# Patient Record
Sex: Female | Born: 1945
Health system: Southern US, Community
[De-identification: ages and names within clinical notes are randomized; demographics above are authoritative.]

## PROBLEM LIST (undated history)

## (undated) DIAGNOSIS — K589 Irritable bowel syndrome without diarrhea: Secondary | ICD-10-CM

## (undated) HISTORY — PX: ABDOMINAL HYSTERECTOMY: SHX81

---

## 2004-11-03 ENCOUNTER — Other Ambulatory Visit: Admission: RE | Admit: 2004-11-03 | Discharge: 2004-11-03 | Payer: Self-pay | Admitting: Obstetrics and Gynecology

## 2010-01-11 ENCOUNTER — Encounter: Admission: RE | Admit: 2010-01-11 | Discharge: 2010-01-11 | Payer: Self-pay | Admitting: Obstetrics and Gynecology

## 2014-12-15 DIAGNOSIS — I728 Aneurysm of other specified arteries: Secondary | ICD-10-CM | POA: Insufficient documentation

## 2014-12-16 DIAGNOSIS — G43709 Chronic migraine without aura, not intractable, without status migrainosus: Secondary | ICD-10-CM | POA: Insufficient documentation

## 2016-12-14 DIAGNOSIS — E785 Hyperlipidemia, unspecified: Secondary | ICD-10-CM | POA: Insufficient documentation

## 2016-12-14 DIAGNOSIS — M25511 Pain in right shoulder: Secondary | ICD-10-CM | POA: Insufficient documentation

## 2016-12-14 DIAGNOSIS — M8580 Other specified disorders of bone density and structure, unspecified site: Secondary | ICD-10-CM | POA: Insufficient documentation

## 2016-12-14 DIAGNOSIS — Z860100 Personal history of colon polyps, unspecified: Secondary | ICD-10-CM | POA: Insufficient documentation

## 2016-12-14 DIAGNOSIS — E559 Vitamin D deficiency, unspecified: Secondary | ICD-10-CM | POA: Insufficient documentation

## 2016-12-14 DIAGNOSIS — G4700 Insomnia, unspecified: Secondary | ICD-10-CM | POA: Insufficient documentation

## 2016-12-14 DIAGNOSIS — I10 Essential (primary) hypertension: Secondary | ICD-10-CM | POA: Insufficient documentation

## 2016-12-14 DIAGNOSIS — K219 Gastro-esophageal reflux disease without esophagitis: Secondary | ICD-10-CM | POA: Insufficient documentation

## 2016-12-14 DIAGNOSIS — N959 Unspecified menopausal and perimenopausal disorder: Secondary | ICD-10-CM | POA: Insufficient documentation

## 2020-04-28 DIAGNOSIS — K5792 Diverticulitis of intestine, part unspecified, without perforation or abscess without bleeding: Secondary | ICD-10-CM | POA: Insufficient documentation

## 2021-06-16 ENCOUNTER — Other Ambulatory Visit: Payer: Self-pay | Admitting: Internal Medicine

## 2021-06-16 DIAGNOSIS — Z1231 Encounter for screening mammogram for malignant neoplasm of breast: Secondary | ICD-10-CM

## 2021-06-16 DIAGNOSIS — E2839 Other primary ovarian failure: Secondary | ICD-10-CM

## 2021-06-16 DIAGNOSIS — I82492 Acute embolism and thrombosis of other specified deep vein of left lower extremity: Secondary | ICD-10-CM

## 2021-06-18 ENCOUNTER — Other Ambulatory Visit: Payer: Self-pay | Admitting: Internal Medicine

## 2021-06-18 DIAGNOSIS — I6529 Occlusion and stenosis of unspecified carotid artery: Secondary | ICD-10-CM

## 2021-06-21 ENCOUNTER — Ambulatory Visit
Admission: RE | Admit: 2021-06-21 | Discharge: 2021-06-21 | Disposition: A | Discharge status: Home / Self Care | Payer: Medicare Other | Source: Ambulatory Visit | Attending: Internal Medicine | Admitting: Internal Medicine

## 2021-06-21 DIAGNOSIS — I82492 Acute embolism and thrombosis of other specified deep vein of left lower extremity: Secondary | ICD-10-CM

## 2021-07-05 ENCOUNTER — Ambulatory Visit
Admission: RE | Admit: 2021-07-05 | Discharge: 2021-07-05 | Disposition: A | Discharge status: Home / Self Care | Payer: Medicare Other | Source: Ambulatory Visit | Attending: Internal Medicine | Admitting: Internal Medicine

## 2021-07-05 DIAGNOSIS — I6529 Occlusion and stenosis of unspecified carotid artery: Secondary | ICD-10-CM

## 2021-07-21 ENCOUNTER — Encounter (HOSPITAL_BASED_OUTPATIENT_CLINIC_OR_DEPARTMENT_OTHER): Payer: Self-pay | Admitting: Physical Therapy

## 2021-07-21 ENCOUNTER — Ambulatory Visit (HOSPITAL_BASED_OUTPATIENT_CLINIC_OR_DEPARTMENT_OTHER): Payer: Medicare Other | Attending: Internal Medicine | Admitting: Physical Therapy

## 2021-07-21 DIAGNOSIS — M6281 Muscle weakness (generalized): Secondary | ICD-10-CM | POA: Insufficient documentation

## 2021-07-21 DIAGNOSIS — M25561 Pain in right knee: Secondary | ICD-10-CM | POA: Insufficient documentation

## 2021-07-21 DIAGNOSIS — G8929 Other chronic pain: Secondary | ICD-10-CM | POA: Diagnosis present

## 2021-07-21 DIAGNOSIS — M25562 Pain in left knee: Secondary | ICD-10-CM | POA: Diagnosis present

## 2021-07-21 NOTE — Therapy (Signed)
?OUTPATIENT PHYSICAL THERAPY LOWER EXTREMITY EVALUATION ? ? ?Patient Name: Adrienne Luna ?MRN: [id number] ?DOB:1945/04/02, 76 y.o., female ?Today's Date: 07/21/2021 ? ? PT End of Session - 07/21/21 1145   ? ? Visit Number 1   ? Date for PT Re-Evaluation 09/15/21   ? Authorization Type Medicare   ? PT Start Time 1145   ? PT Stop Time 1230   ? PT Time Calculation (min) 45 min   ? Activity Tolerance Patient tolerated treatment well   ? ?  ?  ? ?  ? ? ?History reviewed. No pertinent past medical history. ?History reviewed. No pertinent surgical history. ?There are no problems to display for this patient. ? ? ? ?REFERRING PROVIDER: Dr. Rochele Raring ? 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 ?THERAPY DIAG:  ?Bil knee pain; knee stiffness; weakness ? ?ONSET DATE: 03/07/21 ? ?SUBJECTIVE:  ? ?SUBJECTIVE STATEMENT: ?Reports a long history of pain in both knees (left worse) and sciatica on left back/buttock constantly.  I go to a Wellness doctor for 6 weeks doing red light therapy and muscle testing.  It hasn't worked.   ?Walking limited < 1/2 mile;  I almost fell down the stairs when knee gave out.  Scared about descending stairs ? ? ? ?PERTINENT HISTORY:  Moved here from [place] ?Occipital neuralgia  ?GERD (Nexium);  preventive med for BP;  aspirin ?Surgery in August colonectomy had a blood clot in leg ?Vit D3 ?Night take Prevastatin ?Husband has medical issues and she's been caregiver ?4 foot surgeries on right foot ? ?PAIN:  ?Are you having pain? Yes ?NPRS scale: left worse than right  /10 ?Pain location: bil knees  ?  ?Aggravating factors: on my feet; after sitting a while then get up; up and down stairs  ?Relieving factors: copper sleeves  ? ?PRECAUTIONS: None ? ?WEIGHT BEARING RESTRICTIONS No ? ?FALLS:  ?Has patient fallen in last 6 months? No ? ?LIVING ENVIRONMENT: ?Lives with: lives with their spouse ?Lives in: House/apartment ?Stairs: Yes: Internal: 10 steps; on right going up ?OCCUPATION:  retired ? ?PLOF: Independent  young grandchildren ? ?PATIENT GOALS I want to walk to [facility] from the house 1 1/2 miles  ? ? ?OBJECTIVE:  ? ?DIAGNOSTIC FINDINGS: left meniscus tear 2022 had injections ? ?PATIENT SURVEYS:  ?FOTO 43% ? ?COGNITION: ? Overall cognitive status: Within functional limits for tasks assessed   ?  ? ? ?MUSCLE LENGTH: ?Hamstrings: Right 50 deg; Left 50 deg ?Dec hip flexor length to 5 degrees bil ? ?SLS left 3 sec, 2 sec right ? ? ?LE ROM: ? ?Active ROM Right ?07/21/2021 Left ?07/21/2021  ?Hip flexion 100 100  ?Hip extension    ?Hip abduction    ?Hip adduction    ?Hip internal rotation    ?Hip external rotation    ?Knee flexion 127 130  ?Knee extension 3 3  ?Ankle dorsiflexion    ?Ankle plantarflexion    ?Ankle inversion    ?Ankle eversion    ? (Blank rows = not tested) ? ?LE MMT:  Able to rise from standard chair 1x without UE assist ? ?MMT Right ?07/21/2021 Left ?07/21/2021  ?Hip flexion 4+ 4+  ?Hip extension    ?Hip abduction 3+ 3+  ?Hip adduction    ?Hip internal rotation    ?Hip external rotation    ?Knee flexion 4+ 4+  ?Knee extension 4- 4-  ?Ankle dorsiflexion    ?Ankle plantarflexion    ?Ankle inversion    ?Ankle  eversion    ? (Blank rows = not tested) ? ?LOWER EXTREMITY SPECIAL TESTS:  ?Negative hip scour.  No LE neural signs with SLR ? ?FUNCTIONAL TESTS:  ?Able to ascend and descend 4 steps reciprocally with 1 railing ? ?GAIT: ? ?Comments: No device needed;  dec step length  ? ? ? ?TODAY'S TREATMENT: ?Treatment plan ? ? ?PATIENT EDUCATION:  ?Education details: treatment plan ?Person educated: Patient ?Education method: Explanation ?Education comprehension: verbalized understanding ? ? ?HOME EXERCISE PROGRAM: ?Start next visit  ? ?ASSESSMENT: ? ?CLINICAL IMPRESSION: ?Patient is a 76 y.o. female who was seen today for physical therapy evaluation and treatment for bilateral knee pain.  The patient demonstrates mildly decreased knee flexion and extension range of motion.   Strength deficits and asymmetry with opposite knee include decreased quad strength and gluteal strength particularly glute medius muscle with obvious pelvic drop noted in standing and during gait.  Decreased muscle length noted as well in hip flexors and hamstrings.  These deficits and pain cause functional impairments with standing, walking, going up and down curbs and steps at home and in the community.     ? ? ?OBJECTIVE IMPAIRMENTS decreased activity tolerance, difficulty walking, decreased ROM, decreased strength, increased fascial restrictions, and pain.  ? ?ACTIVITY LIMITATIONS cleaning, community activity, and meal prep.  ? ?PERSONAL FACTORS Time since onset of injury/illness/exacerbation are also affecting patient's functional outcome.  ? ? ?REHAB POTENTIAL: Good ? ?CLINICAL DECISION MAKING: Stable/uncomplicated ? ?EVALUATION COMPLEXITY: Low ? ? ?GOALS: ?Goals reviewed with patient? Yes ? ?SHORT TERM GOALS: Target date: 08/18/2021  (Remove Blue Hyperlink) ? ?The patient will demonstrate knowledge of basic self care strategies and exercises to promote healing   ?Baseline: ?Goal status: INITIAL ? ?2.  The patient will report a 35% improvement in pain levels with functional activities which are currently difficult including walking and standing ?Baseline:  ?Goal status: INITIAL ? ?3.  The patient will be able to walk 3/4 mile  ?Baseline:  ?Goal status: INITIAL ? ?4.  The patient will have improved knee and hip strength to at least 4/5 needed for standing, walking longer distances and descending stairs at home and in the community  ?Baseline:  ?Goal status: INITIAL ? ?5.  Improved bil knee ROM 0-135 degrees needed for greater ease with ascending and descending stairs ?Baseline:  ?Goal status: INITIAL ? ? ?LONG TERM GOALS: Target date: 09/15/2021  (Remove Blue Hyperlink) ? ?The patient will be independent in a safe self progression of a home exercise program to promote further recovery of function   ?Baseline:   ?Goal status: INITIAL ? ?2.  The patient will report a 65% improvement in pain levels with functional activities which are currently difficult including walking and standing  ?Baseline:  ?Goal status: INITIAL ? ?3.  The patient will have improved knee and hip strength to at least 4+/5 needed for standing, walking longer distances and descending stairs at home and in the community  ?Baseline:  ?Goal status: INITIAL ? ?4.  The patient will be able to walk 1 1/2 miles to [place] [facility] ?Baseline:  ?Goal status: INITIAL ? ?5.  The patient will have improved FOTO score to    60%   indicating improved function with less pain  ?Baseline:  ?Goal status: INITIAL ? ? ?PLAN: ?PT FREQUENCY: 2x/week ? ?PT DURATION: 8 weeks ? ?PLANNED INTERVENTIONS: Therapeutic exercises, Therapeutic activity, Neuromuscular re-education, Balance training, Gait training, Patient/Family education, Joint mobilization, Aquatic Therapy, Dry Needling, Electrical stimulation, Cryotherapy, Moist heat, Taping, Vasopneumatic  device, Ultrasound, Ionotophoresis 4mg /ml Dexamethasone, and Manual therapy ? ?PLAN FOR NEXT SESSION: aquatic and land based bil knee ROM, strengthening and balance/proprioception ? ?Ruben Im, PT ?07/21/21 3:50 PM ?Phone: [phone] ?Fax: [phone]  ? ? ?

## 2021-09-14 ENCOUNTER — Ambulatory Visit: Payer: Medicare Other | Attending: Internal Medicine | Admitting: Physical Therapy

## 2021-09-14 DIAGNOSIS — M170 Bilateral primary osteoarthritis of knee: Secondary | ICD-10-CM | POA: Diagnosis not present

## 2021-09-14 DIAGNOSIS — G8929 Other chronic pain: Secondary | ICD-10-CM

## 2021-09-14 DIAGNOSIS — M6281 Muscle weakness (generalized): Secondary | ICD-10-CM

## 2021-09-14 DIAGNOSIS — M25552 Pain in left hip: Secondary | ICD-10-CM
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
## 2021-09-21 ENCOUNTER — Ambulatory Visit: Payer: Self-pay

## 2021-09-21 ENCOUNTER — Other Ambulatory Visit: Payer: Self-pay

## 2021-09-22 ENCOUNTER — Ambulatory Visit: Payer: Medicare Other | Admitting: Physical Therapy

## 2021-09-22 DIAGNOSIS — G8929 Other chronic pain: Secondary | ICD-10-CM

## 2021-09-22 DIAGNOSIS — M6281 Muscle weakness (generalized): Secondary | ICD-10-CM

## 2021-09-22 DIAGNOSIS — M25552 Pain in left hip: Secondary | ICD-10-CM

## 2021-09-22 DIAGNOSIS — M170 Bilateral primary osteoarthritis of knee: Secondary | ICD-10-CM | POA: Diagnosis not present

## 2021-09-22 NOTE — Therapy (Signed)
OUTPATIENT PHYSICAL THERAPY LOWER EXTREMITY PROGRESS NOTE/RECERTIFICATION   Patient Name: Adrienne Luna MRN: [id number] DOB:[birth year], 76 y.o., female Today's Date: 09/22/2021   PT End of Session - 09/22/21 1113     Visit Number 3    Date for PT Re-Evaluation 11/09/21    Authorization Type Medicare    PT Start Time 1109   late   PT Stop Time 1153   PT Time Calculation (min) 41 min    Activity Tolerance Patient tolerated treatment well             No past medical history on file. No past surgical history on file. There are no problems to display for this patient.    REFERRING PROVIDER: Dr. Rochele Raring  REFERRING DIAG: M17.0 Bil OA of knee  THERAPY DIAG:  Bil knee pain; knee stiffness; weakness  ONSET DATE: 03/07/21  SUBJECTIVE:   SUBJECTIVE STATEMENT: My hip is still hurting.  Especially after 4 hour [place].  I think I overdid it with stretch strap.    PERTINENT HISTORY:  Moved here from [place] Occipital neuralgia  GERD (Nexium);  preventive med for BP;  aspirin Surgery in August colonectomy had a blood clot in leg Vit D3 Night take Prevastatin Husband has medical issues and she's been caregiver 4 foot surgeries on right foot  PAIN:  Are you having pain? Yes NPRS scale: left worse than right  3/10 Pain location: bil knees; left hip   Aggravating factors: on my feet; after sitting a while then get up; up and down stairs  Relieving factors: copper sleeves  PRECAUTIONS: None  WEIGHT BEARING RESTRICTIONS No  FALLS:  Has patient fallen in last 6 months? No  LIVING ENVIRONMENT: Lives with: lives with their spouse Lives in: House/apartment Stairs: Yes: Internal: 10 steps; on right going up OCCUPATION: retired  PLOF: Independent  young grandchildren  PATIENT GOALS I want to walk to [employer] from the house 1 1/2 miles    OBJECTIVE:   DIAGNOSTIC FINDINGS: left meniscus tear 2022 had  injections  PATIENT SURVEYS:  FOTO 43%  COGNITION:  Overall cognitive status: Within functional limits for tasks assessed       MUSCLE LENGTH: Hamstrings: Right 50 deg; Left 50 deg Dec hip flexor length to 5 degrees bil  SLS left 3 sec, 2 sec right   LE ROM:  Active ROM Right 09/14/2021 Left 09/14/2021  Hip flexion 100 100  Hip extension    Hip abduction    Hip adduction    Hip internal rotation    Hip external rotation    Knee flexion 127 130  Knee extension 3 3  Ankle dorsiflexion    Ankle plantarflexion    Ankle inversion    Ankle eversion     (Blank rows = not tested)  LE MMT:  Able to rise from standard chair 10x without UE assist  MMT Right 09/14/2021 Left 09/14/2021  Hip flexion 4+ 4+  Hip extension    Hip abduction 3+ 3+  Hip adduction    Hip internal rotation    Hip external rotation    Knee flexion 4+ 4+  Knee extension 4- 4-  Ankle dorsiflexion    Ankle plantarflexion    Ankle inversion    Ankle eversion     (Blank rows = not tested)  LOWER EXTREMITY SPECIAL TESTS:  Negative hip scour.  No LE neural signs with SLR  FUNCTIONAL TESTS:  Able to ascend and descend  4 steps reciprocally with 1 railing  GAIT:  Comments: No device needed;  dec step length     TODAY'S TREATMENT: 7/19: Clam 10x left Manual therapy: soft tissue mobilization to left gluteals, lateral thigh, hip long axis distraction, inferior mob, PA hip in internal rotation; contract/relax piriformis/glutes Trigger Point Dry-Needling  Treatment instructions: Expect mild to moderate muscle soreness. S/S of pneumothorax if dry needled over a lung field, and to seek immediate medical attention should they occur. Patient verbalized understanding of these instructions and education.  Patient Consent Given: Yes Education handout provided: Yes Muscles treated: left gluteals  Treatment response/outcome: improved soft tissue length Neuromuscular re-education to activate glute medius  muscle Standing pillow on wall hip abduction isometric 5x 5 sec hold right/left 2 inch step down 10x right/left      7/11: Supine HS stretch with strap straight and with bias right/left 30 sec holds Supine piriformis/fig 4 stretch 3x 30 sec holds right/left Supine abdominal brace 5x Supine abdominal brace with hand to knee push isometric Sidelying clam 10x right/left Seated abdominal brace with back of chair 10x Sit to stand 10x     PATIENT EDUCATION:  Education details: treatment plan Person educated: Patient Education method: Explanation Education comprehension: verbalized understanding   HOME EXERCISE PROGRAM: Access Code: Access Code: [email or web address] URL: [email or web address] Date: 09/22/2021 Prepared by: Lavinia Sharps  Exercises - Seated Scapular Retraction  - 2 x daily - 7 x weekly - 1 sets - 10 reps - Seated Cervical Sidebending Stretch  - 2 x daily - 7 x weekly - 1 sets - 3 reps - 30 hold - Seated Levator Scapulae Stretch (Mirrored)  - 2 x daily - 7 x weekly - 1 sets - 3 reps - 30 hold URL: [email or web address] Date: 09/14/2021 Prepared by: Lavinia Sharps  Exercises - Hooklying Hamstring Stretch with Strap  - 1 x daily - 7 x weekly - 1 sets - 3 reps - 30 hold - Hip Adductors and Hamstring Stretch with Strap  - 1 x daily - 7 x weekly - 1 sets - 3 reps - 30 hold - Supine Figure 4 Piriformis Stretch  - 1 x daily - 7 x weekly - 1 sets - 3 reps - 30 hold - Supine Transversus Abdominis Bracing - Hands on Ground  - 1 x daily - 7 x weekly - 1 sets - 10 reps - Hooklying Isometric Hip Flexion  - 1 x daily - 7 x weekly - 1 sets - 10 reps - Clamshell  - 1 x daily - 7 x weekly - 1 sets - 10 reps - Sit to Stand  - 1 x daily - 7 x weekly - 1 sets - 10 reps   ASSESSMENT:  CLINICAL IMPRESSION: The patient's glute muscles have numerous tender points and inhibit muscle activation causing hip pain and affecting patellofemoral knee alignment.  Good  response to DN of these muscles with much improved activation following which will improve knee control with ascending/descending stairs, prolonged standing and walking.  Verbal cues to avoid compensatory strategies with ex's.          OBJECTIVE IMPAIRMENTS decreased activity tolerance, difficulty walking, decreased ROM, decreased strength, increased fascial restrictions, and pain.   ACTIVITY LIMITATIONS cleaning, community activity, and meal prep.   PERSONAL FACTORS Time since onset of injury/illness/exacerbation are also affecting patient's functional outcome.    REHAB POTENTIAL: Good  CLINICAL DECISION MAKING: Stable/uncomplicated  EVALUATION COMPLEXITY: Low   GOALS: Goals reviewed with patient? Yes  SHORT TERM  GOALS: Target date: 10/12/2021    The patient will demonstrate knowledge of basic self care strategies and exercises to promote healing   Baseline: Goal status: INITIAL  2.  The patient will report a 35% improvement in pain levels with functional activities which are currently difficult including walking and standing Baseline:  Goal status: INITIAL  3.  The patient will be able to walk 3/4 mile  Baseline:  Goal status: INITIAL  4.  The patient will have improved knee and hip strength to at least 4/5 needed for standing, walking longer distances and descending stairs at home and in the community  Baseline:  Goal status: INITIAL  5.  Improved bil knee ROM 0-135 degrees needed for greater ease with ascending and descending stairs Baseline:  Goal status: INITIAL   LONG TERM GOALS: Target date: 11/09/2021    The patient will be independent in a safe self progression of a home exercise program to promote further recovery of function   Baseline:  Goal status: INITIAL  2.  The patient will report a 65% improvement in pain levels with functional activities which are currently difficult including walking and standing  Baseline:  Goal status: INITIAL  3.  The  patient will have improved knee and hip strength to at least 4+/5 needed for standing, walking longer distances and descending stairs at home and in the community  Baseline:  Goal status: INITIAL  4.  The patient will be able to walk 1 1/2 miles to [employer] [facility] Baseline:  Goal status: INITIAL  5.  The patient will have improved FOTO score to    60%   indicating improved function with less pain  Baseline:  Goal status: INITIAL   PLAN: PT FREQUENCY: 2x/week  PT DURATION: 8 weeks  PLANNED INTERVENTIONS: Therapeutic exercises, Therapeutic activity, Neuromuscular re-education, Balance training, Gait training, Patient/Family education, Joint mobilization, Aquatic Therapy, Dry Needling, Electrical stimulation, Cryotherapy, Moist heat, Taping, Vasopneumatic device, Ultrasound, Ionotophoresis 4mg /ml Dexamethasone, and Manual therapy  PLAN FOR NEXT SESSION: assess response to DN to gluteals;  may add ES and include ITB;  add hip flexor stretch on 2nd step;  Nu-Step;   step ups and downs;  hip abduction isometrics;  leg press;  ;  glute med strengthening;   bil knee and hip ROM, strengthening and balance/proprioception  , PT 09/22/21 12:05 PM Phone: [phone] Fax: [phone] Fax: [phone]

## 2021-09-22 NOTE — Patient Instructions (Signed)

## 2021-09-30 ENCOUNTER — Ambulatory Visit: Payer: Medicare Other | Admitting: Physical Therapy

## 2021-09-30 DIAGNOSIS — M25552 Pain in left hip: Secondary | ICD-10-CM

## 2021-09-30 DIAGNOSIS — M170 Bilateral primary osteoarthritis of knee: Secondary | ICD-10-CM | POA: Diagnosis not present

## 2021-09-30 DIAGNOSIS — M6281 Muscle weakness (generalized): Secondary | ICD-10-CM

## 2021-09-30 DIAGNOSIS — G8929 Other chronic pain: Secondary | ICD-10-CM

## 2021-09-30 NOTE — Therapy (Signed)
OUTPATIENT PHYSICAL THERAPY LOWER EXTREMITY PROGRESS NOTE   Patient Name: Adrienne Luna MRN: [id number] DOB:08/07/1945, 76 y.o., female Today's Date: 09/30/2021   PT End of Session - 09/30/21 1058     Visit Number 4    Date for PT Re-Evaluation 11/09/21    Authorization Type Medicare    PT Start Time 1100    PT Stop Time 1140    PT Time Calculation (min) 40 min    Activity Tolerance Patient tolerated treatment well                 No past medical history on file. No past surgical history on file. There are no problems to display for this patient.    REFERRING PROVIDER: Dr. 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  I've had a horrible week.  Everything I do makes it hurt worse.  I've been a couch potato.     PERTINENT HISTORY:  Moved here from [employer] [place] Occipital neuralgia  GERD (Nexium);  preventive med for BP;  aspirin Surgery in August colonectomy had a blood clot in leg Vit D3 Night take Prevastatin Husband has medical issues and she's been caregiver 4 foot surgeries on right foot  PAIN:  Are you having pain? Yes NPRS scale: left worse than right  6/10 Pain location: bil knees; left hip   Aggravating factors: on my feet; after sitting a while then get up; up and down stairs  Relieving factors: copper sleeves  PRECAUTIONS: None  WEIGHT BEARING RESTRICTIONS No  FALLS:  Has patient fallen in last 6 months? No  LIVING ENVIRONMENT: Lives with: lives with their spouse Lives in: House/apartment Stairs: Yes: Internal: 10 steps; on right going up OCCUPATION: retired  PLOF: Independent  young grandchildren  PATIENT GOALS I want to walk to [employer] from the house 1 1/2 miles    OBJECTIVE:   DIAGNOSTIC FINDINGS: left meniscus tear 2022 had injections  PATIENT  SURVEYS:  FOTO 43%  COGNITION:  Overall cognitive status: Within functional limits for tasks assessed       MUSCLE LENGTH: Hamstrings: Right 50 deg; Left 50 deg Dec hip flexor length to 5 degrees bil  SLS left 3 sec, 2 sec right   LE ROM:  Active ROM Right 09/14/2021 Left 09/14/2021  Hip flexion 100 100  Hip extension    Hip abduction    Hip adduction    Hip internal rotation    Hip external rotation    Knee flexion 127 130  Knee extension 3 3  Ankle dorsiflexion    Ankle plantarflexion    Ankle inversion    Ankle eversion     (Blank rows = not tested)  LE MMT:  Able to rise from standard chair 10x without UE assist  MMT Right 09/14/2021 Left 09/14/2021  Hip flexion 4+ 4+  Hip extension    Hip abduction 3+ 3+  Hip adduction    Hip internal rotation    Hip external rotation    Knee flexion 4+ 4+  Knee extension 4- 4-  Ankle dorsiflexion    Ankle plantarflexion    Ankle inversion    Ankle eversion     (Blank rows = not tested)  LOWER EXTREMITY SPECIAL TESTS:  Negative hip scour.  No LE neural signs with SLR  FUNCTIONAL TESTS:  Able to ascend and descend 4 steps reciprocally with 1 railing  GAIT:  Comments: No device needed;  dec step length     TODAY'S TREATMENT:  7/27:  Manual therapy: soft tissue mobilization to left gluteals, lateral thigh, hip long axis distraction, inferior mob, PA hip in internal rotation; contract/relax piriformis/glutes;  Addaday to gluteals Supine with green ball: hip/knee flexion 8x, lumbar rotation 8x; piriformis stretch right/left Seated green ball roll outs for lumbar fascial stretch forward and with side to side bias Standing hip abduction and extension ROM Discussed "muscle pumping" with exercise to help with pain reduction Gave info on home TENS unit to help with upcoming travel     7/19: Clam 10x left Manual therapy: soft tissue mobilization to left gluteals, lateral thigh, hip long axis distraction, inferior  mob, PA hip in internal rotation; contract/relax piriformis/glutes Trigger Point Dry-Needling  Treatment instructions: Expect mild to moderate muscle soreness. S/S of pneumothorax if dry needled over a lung field, and to seek immediate medical attention should they occur. Patient verbalized understanding of these instructions and education.  Patient Consent Given: Yes Education handout provided: Yes Muscles treated: left gluteals  Treatment response/outcome: improved soft tissue length Neuromuscular re-education to activate glute medius muscle Standing pillow on wall hip abduction isometric 5x 5 sec hold right/left 2 inch step down 10x right/left      7/11: Supine HS stretch with strap straight and with bias right/left 30 sec holds Supine piriformis/fig 4 stretch 3x 30 sec holds right/left Supine abdominal brace 5x Supine abdominal brace with hand to knee push isometric Sidelying clam 10x right/left Seated abdominal brace with back of chair 10x Sit to stand 10x     PATIENT EDUCATION:  Education details: treatment plan Person educated: Patient Education method: Explanation Education comprehension: verbalized understanding   HOME EXERCISE PROGRAM: Access Code: Access Code: [email or web address] URL: [email or web address] Date: 09/22/2021 Prepared by: Lavinia Sharps  Exercises - Seated Scapular Retraction  - 2 x daily - 7 x weekly - 1 sets - 10 reps - Seated Cervical Sidebending Stretch  - 2 x daily - 7 x weekly - 1 sets - 3 reps - 30 hold - Seated Levator Scapulae Stretch (Mirrored)  - 2 x daily - 7 x weekly - 1 sets - 3 reps - 30 hold URL: [email or web address] Date: 09/14/2021 Prepared by: Lavinia Sharps  Exercises - Hooklying Hamstring Stretch with Strap  - 1 x daily - 7 x weekly - 1 sets - 3 reps - 30 hold - Hip Adductors and Hamstring Stretch with Strap  - 1 x daily - 7 x weekly - 1 sets - 3 reps - 30 hold - Supine Figure 4 Piriformis Stretch  - 1 x  daily - 7 x weekly - 1 sets - 3 reps - 30 hold - Supine Transversus Abdominis Bracing - Hands on Ground  - 1 x daily - 7 x weekly - 1 sets - 10 reps - Hooklying Isometric Hip Flexion  - 1 x daily - 7 x weekly - 1 sets - 10 reps - Clamshell  - 1 x daily - 7 x weekly - 1 sets - 10 reps - Sit to Stand  - 1 x daily - 7 x weekly - 1 sets - 10 reps   ASSESSMENT:  CLINICAL IMPRESSION: The patient reports a flare up/change in pain location to higher on hip following DN last session.  She states the soreness has affected function and mobility.  Other techniques performed manually for myofascial release  today and encouraged regular ROM to further help with pain modulation.  Therapist monitoring response to all interventions and modifying treatment accordingly.     OBJECTIVE IMPAIRMENTS decreased activity tolerance, difficulty walking, decreased ROM, decreased strength, increased fascial restrictions, and pain.   ACTIVITY LIMITATIONS cleaning, community activity, and meal prep.   PERSONAL FACTORS Time since onset of injury/illness/exacerbation are also affecting patient's functional outcome.    REHAB POTENTIAL: Good  CLINICAL DECISION MAKING: Stable/uncomplicated  EVALUATION COMPLEXITY: Low   GOALS: Goals reviewed with patient? Yes  SHORT TERM GOALS: Target date: 10/12/2021    The patient will demonstrate knowledge of basic self care strategies and exercises to promote healing   Baseline: Goal status: INITIAL  2.  The patient will report a 35% improvement in pain levels with functional activities which are currently difficult including walking and standing Baseline:  Goal status: INITIAL  3.  The patient will be able to walk 3/4 mile  Baseline:  Goal status: INITIAL  4.  The patient will have improved knee and hip strength to at least 4/5 needed for standing, walking longer distances and descending stairs at home and in the community  Baseline:  Goal status: INITIAL  5.  Improved  bil knee ROM 0-135 degrees needed for greater ease with ascending and descending stairs Baseline:  Goal status: INITIAL   LONG TERM GOALS: Target date: 11/09/2021    The patient will be independent in a safe self progression of a home exercise program to promote further recovery of function   Baseline:  Goal status: INITIAL  2.  The patient will report a 65% improvement in pain levels with functional activities which are currently difficult including walking and standing  Baseline:  Goal status: INITIAL  3.  The patient will have improved knee and hip strength to at least 4+/5 needed for standing, walking longer distances and descending stairs at home and in the community  Baseline:  Goal status: INITIAL  4.  The patient will be able to walk 1 1/2 miles to [employer] [facility] Baseline:  Goal status: INITIAL  5.  The patient will have improved FOTO score to    60%   indicating improved function with less pain  Baseline:  Goal status: INITIAL   PLAN: PT FREQUENCY: 2x/week  PT DURATION: 8 weeks  PLANNED INTERVENTIONS: Therapeutic exercises, Therapeutic activity, Neuromuscular re-education, Balance training, Gait training, Patient/Family education, Joint mobilization, Aquatic Therapy, Dry Needling, Electrical stimulation, Cryotherapy, Moist heat, Taping, Vasopneumatic device, Ultrasound, Ionotophoresis 4mg /ml Dexamethasone, and Manual therapy  PLAN FOR NEXT SESSION: pt out of town to [facility] next week; pt plans to order TENS;  hold on DN based on flare up response;  add hip flexor stretch on 2nd step;  Nu-Step;   step ups and downs;  hip abduction isometrics;  leg press;   glute med strengthening;   bil knee and hip ROM, strengthening and balance/proprioception  [facility], PT 09/30/21 5:14 PM Phone: [phone] Fax: [phone]

## 2021-09-30 NOTE — Patient Instructions (Signed)

## 2021-10-04 ENCOUNTER — Ambulatory Visit: Payer: Medicare Other | Admitting: Physical Therapy

## 2021-10-04 ENCOUNTER — Encounter: Payer: Self-pay | Admitting: Physical Therapy

## 2021-10-04 DIAGNOSIS — M170 Bilateral primary osteoarthritis of knee: Secondary | ICD-10-CM | POA: Diagnosis not present

## 2021-10-04 DIAGNOSIS — M6281 Muscle weakness (generalized): Secondary | ICD-10-CM

## 2021-10-04 DIAGNOSIS — M25552 Pain in left hip: Secondary | ICD-10-CM

## 2021-10-04 DIAGNOSIS — G8929 Other chronic pain: Secondary | ICD-10-CM

## 2021-10-04 NOTE — Therapy (Signed)
OUTPATIENT PHYSICAL THERAPY LOWER EXTREMITY PROGRESS NOTE   Patient Name: Adrienne Luna MRN: [id number] DOB:10-26-1945, 76 y.o., female Today's Date: 10/04/2021   PT End of Session - 10/04/21 1148     Visit Number 5    Date for PT Re-Evaluation 11/09/21    Authorization Type Medicare    PT Start Time 1148    PT Stop Time 1230    PT Time Calculation (min) 42 min    Activity Tolerance Patient tolerated treatment well    Behavior During Therapy [facility] for tasks assessed/performed                  History reviewed. No pertinent past medical history. History reviewed. No pertinent surgical history. There are no problems to display for this patient.    REFERRING PROVIDER: Dr. Hillard Danker  REFERRING DIAG: M17.0 Bil OA of knee  THERAPY DIAG:  Bil knee pain; knee stiffness; weakness  ONSET DATE: 03/07/21  SUBJECTIVE:   SUBJECTIVE STATEMENT: Hip is improving. I got a massager Sat and a TENS unit.   PERTINENT HISTORY:  Moved here from [employer] [place]  Occipital neuralgia  GERD (Nexium);  preventive med for BP;  aspirin Surgery in August colonectomy had a blood clot in leg Vit D3 Night take Prevastatin Husband has medical issues and she's been caregiver 4 foot surgeries on right foot  PAIN:  Are you having pain? Yes NPRS scale: left worse than right  5/10 Pain location: bil knees; left hip   Aggravating factors: on my feet; after sitting a while then get up; up and down stairs  Relieving factors: copper sleeves  PRECAUTIONS: None  WEIGHT BEARING RESTRICTIONS No  FALLS:  Has patient fallen in last 6 months? No  LIVING ENVIRONMENT: Lives with: lives with their spouse Lives in: House/apartment Stairs: Yes: Internal: 10 steps; on right going up OCCUPATION: retired  PLOF: Independent  young grandchildren  PATIENT GOALS I want to walk to [employer] from the house 1 1/2 miles    OBJECTIVE:   DIAGNOSTIC FINDINGS: left meniscus  tear 2022 had injections  PATIENT SURVEYS:  FOTO 43%  COGNITION:  Overall cognitive status: Within functional limits for tasks assessed       MUSCLE LENGTH: Hamstrings: Right 50 deg; Left 50 deg Dec hip flexor length to 5 degrees bil  SLS left 3 sec, 2 sec right   LE ROM:  Active ROM Right 09/14/2021 Left 09/14/2021  Hip flexion 100 100  Hip extension    Hip abduction    Hip adduction    Hip internal rotation    Hip external rotation    Knee flexion 127 130  Knee extension 3 3  Ankle dorsiflexion    Ankle plantarflexion    Ankle inversion    Ankle eversion     (Blank rows = not tested)  LE MMT:  Able to rise from standard chair 10x without UE assist  MMT Right 09/14/2021 Left 09/14/2021  Hip flexion 4+ 4+  Hip extension    Hip abduction 3+ 3+  Hip adduction    Hip internal rotation    Hip external rotation    Knee flexion 4+ 4+  Knee extension 4- 4-  Ankle dorsiflexion    Ankle plantarflexion    Ankle inversion    Ankle eversion     (Blank rows = not tested)  LOWER EXTREMITY SPECIAL TESTS:  Negative hip scour.  No LE neural signs with SLR  FUNCTIONAL TESTS:  Able to ascend  and descend 4 steps reciprocally with 1 railing  GAIT:  Comments: No device needed;  dec step length     TODAY'S TREATMENT:  10/04/21: MANUAL: Hands on and Addaday assist soft tissue work and MFR to RT hip in sidelying and supine. SELF CARE: Reviewed Massage use and parameters for home use. Pt verbally understood all concepts. Review of TENS unit but looks like pt bought one that only fits the lumbar spine and will not be able to be used on her hip.     7/27:  Manual therapy: soft tissue mobilization to left gluteals, lateral thigh, hip long axis distraction, inferior mob, PA hip in internal rotation; contract/relax piriformis/glutes;  Addaday to gluteals Supine with green ball: hip/knee flexion 8x, lumbar rotation 8x; piriformis stretch right/left Seated green ball roll  outs for lumbar fascial stretch forward and with side to side bias Standing hip abduction and extension ROM Discussed "muscle pumping" with exercise to help with pain reduction Gave info on home TENS unit to help with upcoming travel     7/19: Clam 10x left Manual therapy: soft tissue mobilization to left gluteals, lateral thigh, hip long axis distraction, inferior mob, PA hip in internal rotation; contract/relax piriformis/glutes Trigger Point Dry-Needling  Treatment instructions: Expect mild to moderate muscle soreness. S/S of pneumothorax if dry needled over a lung field, and to seek immediate medical attention should they occur. Patient verbalized understanding of these instructions and education.  Patient Consent Given: Yes Education handout provided: Yes Muscles treated: left gluteals  Treatment response/outcome: improved soft tissue length Neuromuscular re-education to activate glute medius muscle Standing pillow on wall hip abduction isometric 5x 5 sec hold right/left 2 inch step down 10x right/left      7/11: Supine HS stretch with strap straight and with bias right/left 30 sec holds Supine piriformis/fig 4 stretch 3x 30 sec holds right/left Supine abdominal brace 5x Supine abdominal brace with hand to knee push isometric Sidelying clam 10x right/left Seated abdominal brace with back of chair 10x Sit to stand 10x     PATIENT EDUCATION:  Education details: treatment plan Person educated: Patient Education method: Explanation Education comprehension: verbalized understanding   HOME EXERCISE PROGRAM: Access Code: Access Code: [email or web address] URL: [email or web address] Date: 09/22/2021 Prepared by: Lavinia Sharps  Exercises - Seated Scapular Retraction  - 2 x daily - 7 x weekly - 1 sets - 10 reps - Seated Cervical Sidebending Stretch  - 2 x daily - 7 x weekly - 1 sets - 3 reps - 30 hold - Seated Levator Scapulae Stretch (Mirrored)  - 2 x daily - 7 x  weekly - 1 sets - 3 reps - 30 hold URL: [email or web address] Date: 09/14/2021 Prepared by: Lavinia Sharps  Exercises - Hooklying Hamstring Stretch with Strap  - 1 x daily - 7 x weekly - 1 sets - 3 reps - 30 hold - Hip Adductors and Hamstring Stretch with Strap  - 1 x daily - 7 x weekly - 1 sets - 3 reps - 30 hold - Supine Figure 4 Piriformis Stretch  - 1 x daily - 7 x weekly - 1 sets - 3 reps - 30 hold - Supine Transversus Abdominis Bracing - Hands on Ground  - 1 x daily - 7 x weekly - 1 sets - 10 reps - Hooklying Isometric Hip Flexion  - 1 x daily - 7 x weekly - 1 sets - 10 reps - Clamshell  - 1 x daily - 7 x weekly - 1 sets -  10 reps - Sit to Stand  - 1 x daily - 7 x weekly - 1 sets - 10 reps   ASSESSMENT:  CLINICAL IMPRESSION: Pt arrives to pt with improving hip pain but it still bothers her. Pt has purchased a massage gun and is currently using. The TENS unit she bought seems to be for only the lumbar spine so she will return and find another one. Soft tissue and myofascial release provided to the Lt hip, LT Rectus Femoris was like a thick rubber band.     OBJECTIVE IMPAIRMENTS decreased activity tolerance, difficulty walking, decreased ROM, decreased strength, increased fascial restrictions, and pain.   ACTIVITY LIMITATIONS cleaning, community activity, and meal prep.   PERSONAL FACTORS Time since onset of injury/illness/exacerbation are also affecting patient's functional outcome.    REHAB POTENTIAL: Good  CLINICAL DECISION MAKING: Stable/uncomplicated  EVALUATION COMPLEXITY: Low   GOALS: Goals reviewed with patient? Yes  SHORT TERM GOALS: Target date: 10/12/2021    The patient will demonstrate knowledge of basic self care strategies and exercises to promote healing   Baseline: Goal status: INITIAL  2.  The patient will report a 35% improvement in pain levels with functional activities which are currently difficult including walking and  standing Baseline:  Goal status: INITIAL  3.  The patient will be able to walk 3/4 mile  Baseline:  Goal status: INITIAL  4.  The patient will have improved knee and hip strength to at least 4/5 needed for standing, walking longer distances and descending stairs at home and in the community  Baseline:  Goal status: INITIAL  5.  Improved bil knee ROM 0-135 degrees needed for greater ease with ascending and descending stairs Baseline:  Goal status: INITIAL   LONG TERM GOALS: Target date: 11/09/2021    The patient will be independent in a safe self progression of a home exercise program to promote further recovery of function   Baseline:  Goal status: INITIAL  2.  The patient will report a 65% improvement in pain levels with functional activities which are currently difficult including walking and standing  Baseline:  Goal status: INITIAL  3.  The patient will have improved knee and hip strength to at least 4+/5 needed for standing, walking longer distances and descending stairs at home and in the community  Baseline:  Goal status: INITIAL  4.  The patient will be able to walk 1 1/2 miles to [employer] [facility] Baseline:  Goal status: INITIAL  5.  The patient will have improved FOTO score to    60%   indicating improved function with less pain  Baseline:  Goal status: INITIAL   PLAN: PT FREQUENCY: 2x/week  PT DURATION: 8 weeks  PLANNED INTERVENTIONS: Therapeutic exercises, Therapeutic activity, Neuromuscular re-education, Balance training, Gait training, Patient/Family education, Joint mobilization, Aquatic Therapy, Dry Needling, Electrical stimulation, Cryotherapy, Moist heat, Taping, Vasopneumatic device, Ultrasound, Ionotophoresis 4mg /ml Dexamethasone, and Manual therapy  PLAN FOR NEXT SESSION: pt out of town to [facility] next week; pt plans to order TENS;  hold on DN based on flare up response;  add hip flexor stretch on 2nd step;  Nu-Step;   step ups and downs;  hip  abduction isometrics;  leg press;   glute med strengthening;   bil knee and hip ROM, strengthening and balance/proprioception  [facility], PTA 10/04/21 12:58 PM  Phone: [phone] Fax: [phone]

## 2021-10-05 ENCOUNTER — Ambulatory Visit: Payer: Medicare Other | Admitting: Physical Therapy

## 2021-10-07 ENCOUNTER — Encounter: Payer: Medicare Other | Admitting: Physical Therapy

## 2021-10-12 ENCOUNTER — Encounter: Payer: Medicare Other | Admitting: Physical Therapy

## 2021-10-14 ENCOUNTER — Encounter: Payer: Medicare Other | Admitting: Physical Therapy

## 2021-10-19 ENCOUNTER — Ambulatory Visit: Payer: Medicare Other | Attending: Internal Medicine | Admitting: Physical Therapy

## 2021-10-19 DIAGNOSIS — M25552 Pain in left hip: Secondary | ICD-10-CM | POA: Diagnosis present

## 2021-10-19 DIAGNOSIS — M25561 Pain in right knee: Secondary | ICD-10-CM | POA: Insufficient documentation

## 2021-10-19 DIAGNOSIS — G8929 Other chronic pain: Secondary | ICD-10-CM | POA: Insufficient documentation

## 2021-10-19 DIAGNOSIS — M25562 Pain in left knee: Secondary | ICD-10-CM | POA: Insufficient documentation

## 2021-10-19 DIAGNOSIS — M6281 Muscle weakness (generalized): Secondary | ICD-10-CM | POA: Diagnosis present

## 2021-10-19 NOTE — Therapy (Signed)
OUTPATIENT PHYSICAL THERAPY LOWER EXTREMITY PROGRESS NOTE   Patient Name: Adrienne Luna MRN: [id number] DOB:10/19/1945, 75 y.o., female Today's Date: 10/19/2021   PT End of Session - 10/19/21 1241     Visit Number 6    Date for PT Re-Evaluation 11/09/21    Authorization Type Medicare    PT Start Time 1234    PT Stop Time 1315    PT Time Calculation (min) 41 min    Activity Tolerance Patient tolerated treatment well                  No past medical history on file. No past surgical history on file. There are no problems to display for this patient.    REFERRING PROVIDER: Dr. 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  Lots of steps and walking on the trip ([place]).  My hip hurts worse.  Knees 80% better.    PERTINENT HISTORY:  Moved here from [place]  Occipital neuralgia  GERD (Nexium);  preventive med for BP;  aspirin Surgery in August colonectomy had a blood clot in leg Vit D3 Night take Prevastatin Husband has medical issues and she's been caregiver 4 foot surgeries on right foot  PAIN:  Are you having pain? Yes NPRS scale: left buttock pain  5/10 Pain location: bil knees; left hip   Aggravating factors: on my feet; after sitting a while then get up; up and down stairs  Relieving factors: copper sleeves  PRECAUTIONS: None  WEIGHT BEARING RESTRICTIONS No  FALLS:  Has patient fallen in last 6 months? No  LIVING ENVIRONMENT: Lives with: lives with their spouse Lives in: House/apartment Stairs: Yes: Internal: 10 steps; on right going up OCCUPATION: retired  PLOF: Independent  young grandchildren  PATIENT GOALS I want to walk to [place] from the house 1 1/2 miles    OBJECTIVE:   DIAGNOSTIC FINDINGS: left meniscus tear 2022 had  injections  PATIENT SURVEYS:  FOTO 43%  COGNITION:  Overall cognitive status: Within functional limits for tasks assessed       MUSCLE LENGTH: Hamstrings: Right 50 deg; Left 50 deg Dec hip flexor length to 5 degrees bil  SLS left 3 sec, 2 sec right   LE ROM:  Active ROM Right 09/14/2021 Left 09/14/2021 8/15 Right/left   Hip flexion 100 100   Hip extension     Hip abduction     Hip adduction     Hip internal rotation     Hip external rotation     Knee flexion 127 130 135  Knee extension 3 3 0  Ankle dorsiflexion     Ankle plantarflexion     Ankle inversion     Ankle eversion      (Blank rows = not tested)  LE MMT:  Able to rise from standard chair 10x without UE assist  MMT Right 09/14/2021 Left 09/14/2021 8/15 Right/left  Hip flexion 4+ 4+ 4+  Hip extension     Hip abduction 3+ 3+ 4-  Hip adduction     Hip internal rotation     Hip external rotation     Knee flexion 4+ 4+ 4+  Knee extension 4- 4- 4  Ankle dorsiflexion     Ankle plantarflexion     Ankle inversion     Ankle eversion      (  Blank rows = not tested)  LOWER EXTREMITY SPECIAL TESTS:  Negative hip scour.  No LE neural signs with SLR  FUNCTIONAL TESTS:  Able to ascend and descend 4 steps reciprocally with 1 railing  GAIT:  Comments: No device needed;  dec step length     TODAY'S TREATMENT:  8/15: Discussed response to treatment so far Review of goals Manual therapy: soft tissue mobilization to left gluteals, lateral thigh, hip long axis distraction, inferior mob, PA hip in internal rotation; contract/relax piriformis/glutes Trigger Point Dry-Needling  Treatment instructions: Expect mild to moderate muscle soreness. S/S of pneumothorax if dry needled over a lung field, and to seek immediate medical attention should they occur. Patient verbalized understanding of these instructions and education.  Patient Consent Given: Yes Education handout provided: Yes Muscles treated: left gluteals  sidelying with ES 30 pps 10 min     10/04/21: MANUAL: Hands on and Addaday assist soft tissue work and MFR to RT hip in sidelying and supine. SELF CARE: Reviewed Massage use and parameters for home use. Pt verbally understood all concepts. Review of TENS unit but looks like pt bought one that only fits the lumbar spine and will not be able to be used on her hip.     7/27:  Manual therapy: soft tissue mobilization to left gluteals, lateral thigh, hip long axis distraction, inferior mob, PA hip in internal rotation; contract/relax piriformis/glutes;  Addaday to gluteals Supine with green ball: hip/knee flexion 8x, lumbar rotation 8x; piriformis stretch right/left Seated green ball roll outs for lumbar fascial stretch forward and with side to side bias Standing hip abduction and extension ROM Discussed "muscle pumping" with exercise to help with pain reduction Gave info on home TENS unit to help with upcoming travel    PATIENT EDUCATION:  Education details: treatment plan Person educated: Patient Education method: Explanation Education comprehension: verbalized understanding   HOME EXERCISE PROGRAM: Access Code: Access Code: [email or web address] URL: [email or web address] Date: 09/22/2021 Prepared by: Stacy Simpson  Exercises - Seated Scapular Retraction  - 2 x daily - 7 x weekly - 1 sets - 10 reps - Seated Cervical Sidebending Stretch  - 2 x daily - 7 x weekly - 1 sets - 3 reps - 30 hold - Seated Levator Scapulae Stretch (Mirrored)  - 2 x daily - 7 x weekly - 1 sets - 3 reps - 30 hold URL: [email or web address] Date: 09/14/2021 Prepared by: Stacy Simpson  Exercises - Hooklying Hamstring Stretch with Strap  - 1 x daily - 7 x weekly - 1 sets - 3 reps - 30 hold - Hip Adductors and Hamstring Stretch with Strap  - 1 x daily - 7 x weekly - 1 sets - 3 reps - 30 hold - Supine Figure 4 Piriformis Stretch  - 1 x daily - 7 x weekly - 1 sets - 3 reps - 30 hold - Supine  Transversus Abdominis Bracing - Hands on Ground  - 1 x daily - 7 x weekly - 1 sets - 10 reps - Hooklying Isometric Hip Flexion  - 1 x daily - 7 x weekly - 1 sets - 10 reps - Clamshell  - 1 x daily - 7 x weekly - 1 sets - 10 reps - Sit to Stand  - 1 x daily - 7 x weekly - 1 sets - 10 reps   ASSESSMENT:  CLINICAL IMPRESSION: Overall improvement in knee pain 80% since start of care.  Left buttock pain has been limiting but able to walk quite   a bit while on trip to [place].  Added ES to DN for improved longer term benefit and further reduction in pain.  All STGS met.     OBJECTIVE IMPAIRMENTS decreased activity tolerance, difficulty walking, decreased ROM, decreased strength, increased fascial restrictions, and pain.   ACTIVITY LIMITATIONS cleaning, community activity, and meal prep.   PERSONAL FACTORS Time since onset of injury/illness/exacerbation are also affecting patient's functional outcome.    REHAB POTENTIAL: Good  CLINICAL DECISION MAKING: Stable/uncomplicated  EVALUATION COMPLEXITY: Low   GOALS: Goals reviewed with patient? Yes  SHORT TERM GOALS: Target date: 10/12/2021    The patient will demonstrate knowledge of basic self care strategies and exercises to promote healing   Baseline: Goal status: goal met 8/15  2.  The patient will report a 35% improvement in pain levels with functional activities which are currently difficult including walking and standing Baseline:  Goal status: goal met 8/15  3.  The patient will be able to walk 3/4 mile  Baseline:  Goal status: goal met 8/15 4.  The patient will have improved knee and hip strength to at least 4/5 needed for standing, walking longer distances and descending stairs at home and in the community  Baseline:  Goal status: partially met 8/15 5.  Improved bil knee ROM 0-135 degrees needed for greater ease with ascending and descending stairs Baseline:  Goal status: goal met 8/15  LONG TERM GOALS: Target date:  11/09/2021    The patient will be independent in a safe self progression of a home exercise program to promote further recovery of function   Baseline:  Goal status: INITIAL  2.  The patient will report a 65% improvement in pain levels with functional activities which are currently difficult including walking and standing  Baseline:  Goal status: goal met 8/15  3.  The patient will have improved knee and hip strength to at least 4+/5 needed for standing, walking longer distances and descending stairs at home and in the community  Baseline:  Goal status: INITIAL  4.  The patient will be able to walk 1 1/2 miles to [place] Baseline:  Goal status: INITIAL  5.  The patient will have improved FOTO score to    60%   indicating improved function with less pain  Baseline:  Goal status: INITIAL   PLAN: PT FREQUENCY: 2x/week  PT DURATION: 8 weeks  PLANNED INTERVENTIONS: Therapeutic exercises, Therapeutic activity, Neuromuscular re-education, Balance training, Gait training, Patient/Family education, Joint mobilization, Aquatic Therapy, Dry Needling, Electrical stimulation, Cryotherapy, Moist heat, Taping, Vasopneumatic device, Ultrasound, Ionotophoresis 4mg/ml Dexamethasone, and Manual therapy  PLAN FOR NEXT SESSION:  assess response to DN with ES to left gluteals;  LE strengthening particularly quads, HS and glutes   Stacy Simpson, PT 10/19/21 1:10 PM Phone: [phone] Fax: [phone]      

## 2021-10-21 ENCOUNTER — Ambulatory Visit: Payer: Medicare Other | Admitting: Physical Therapy

## 2021-10-21 DIAGNOSIS — M25552 Pain in left hip: Secondary | ICD-10-CM

## 2021-10-21 DIAGNOSIS — M6281 Muscle weakness (generalized): Secondary | ICD-10-CM | POA: Diagnosis not present

## 2021-10-21 DIAGNOSIS — G8929 Other chronic pain: Secondary | ICD-10-CM

## 2021-10-21 NOTE — Therapy (Signed)
OUTPATIENT PHYSICAL THERAPY LOWER EXTREMITY PROGRESS NOTE   Patient Name: Adrienne Luna MRN: [id number] DOB:[birth year], 76 y.o., female Today's Date: 10/21/2021   PT End of Session - 10/21/21 1236     Visit Number 7    Date for PT Re-Evaluation 11/09/21    Authorization Type Medicare    PT Start Time 1232    PT Stop Time 1315    PT Time Calculation (min) 43 min    Activity Tolerance Patient tolerated treatment well                  No past medical history on file. No past surgical history on file. There are no problems to display for this patient.    REFERRING PROVIDER: Dr. Rochele Raring  REFERRING DIAG: M17.0 Bil OA of knee  THERAPY DIAG:  Bil knee pain; knee stiffness; weakness  ONSET DATE: 03/07/21  SUBJECTIVE:   SUBJECTIVE STATEMENT: My right groin hurts today.  I've been doing my stretches with the strap.  The clam ex is challenging.      PERTINENT HISTORY:  Moved here from [employer] [place]  Occipital neuralgia  GERD (Nexium);  preventive med for BP;  aspirin Surgery in August colonectomy had a blood clot in leg Vit D3 Night take Prevastatin Husband has medical issues and she's been caregiver 4 foot surgeries on right foot  PAIN:  Are you having pain? Yes NPRS scale: right groin 4/10 Pain location: bil knees; left hip   Aggravating factors: on my feet; after sitting a while then get up; up and down stairs  Relieving factors: copper sleeves  PRECAUTIONS: None  WEIGHT BEARING RESTRICTIONS No  FALLS:  Has patient fallen in last 6 months? No  LIVING ENVIRONMENT: Lives with: lives with their spouse Lives in: House/apartment Stairs: Yes: Internal: 10 steps; on right going up OCCUPATION: retired  PLOF: Independent  young grandchildren  PATIENT GOALS I want to walk to [employer] from the house 1 1/2 miles    OBJECTIVE:   DIAGNOSTIC FINDINGS: left meniscus tear 2022 had injections  PATIENT SURVEYS:  FOTO  43%  COGNITION:  Overall cognitive status: Within functional limits for tasks assessed       MUSCLE LENGTH: Hamstrings: Right 50 deg; Left 50 deg Dec hip flexor length to 5 degrees bil  SLS left 3 sec, 2 sec right   LE ROM:  Active ROM Right 09/14/2021 Left 09/14/2021 8/15 Right/left   Hip flexion 100 100   Hip extension     Hip abduction     Hip adduction     Hip internal rotation     Hip external rotation     Knee flexion 127 130 135  Knee extension 3 3 0  Ankle dorsiflexion     Ankle plantarflexion     Ankle inversion     Ankle eversion      (Blank rows = not tested)  LE MMT:  Able to rise from standard chair 10x without UE assist  MMT Right 09/14/2021 Left 09/14/2021 8/15 Right/left  Hip flexion 4+ 4+ 4+  Hip extension     Hip abduction 3+ 3+ 4-  Hip adduction     Hip internal rotation     Hip external rotation     Knee flexion 4+ 4+ 4+  Knee extension 4- 4- 4  Ankle dorsiflexion     Ankle plantarflexion     Ankle inversion     Ankle eversion      (Blank rows =  not tested)  LOWER EXTREMITY SPECIAL TESTS:  Negative hip scour.  No LE neural signs with SLR  FUNCTIONAL TESTS:  Able to ascend and descend 4 steps reciprocally with 1 railing  GAIT:  Comments: No device needed;  dec step length     TODAY'S TREATMENT: 8/17: Manual therapy: right hip long axis distraction, inferior mob, PA hip in internal rotation; prone PA  straight, internal and external rotation grade 3 3x 30 sec each Nu-Step 7 min L1 steps per minute 100 Seated Red and green band knee extension looped in fig 8 around feet 2x10 Seated Red and green band knee flexion/hamstring curl 2x 10 Seated green band clams 20x      8/15: Discussed response to treatment so far Review of goals Manual therapy: soft tissue mobilization to left gluteals, lateral thigh, hip long axis distraction, inferior mob, PA hip in internal rotation; contract/relax piriformis/glutes Trigger Point  Dry-Needling  Treatment instructions: Expect mild to moderate muscle soreness. S/S of pneumothorax if dry needled over a lung field, and to seek immediate medical attention should they occur. Patient verbalized understanding of these instructions and education.  Patient Consent Given: Yes Education handout provided: Yes Muscles treated: left gluteals sidelying with ES 30 pps 10 min   PATIENT EDUCATION:  Education details: treatment plan Person educated: Patient Education method: Explanation Education comprehension: verbalized understanding   HOME EXERCISE PROGRAM: Access Code: [email or web address] URL: [email or web address] Date: 10/21/2021 Prepared by: [place] Hamstring Stretch with Strap  - 1 x daily - 7 x weekly - 1 sets - Hip Adductors and Hamstring Stretch with Strap  - 1 x daily - 7 x weekly - 1 sets - 3 reps - 30 hold - Supine ITB Stretch with Strap  - 1 x daily - 7 x weekly - 1 sets - 3 reps - 30 hold - Supine Figure 4 Piriformis Stretch  - 1 x daily - 7 x weekly - 1 sets - 3 reps - 30 hold - Hooklying Isometric Hip Flexion  - 1 x daily - 7 x weekly - 1 sets - 10 reps - Beginner Clam  - 1 x daily - 7 x weekly - 1 sets - 10 reps - Seated Hip Abduction with Resistance  - 1 x daily - 7 x weekly - 1 sets - 10 reps - Seated Knee Extension with Resistance  - 1 x daily - 7 x weekly - 2 sets - 10 reps - Seated Hamstring Curl with Anchored Resistance  - 1 x daily - 7 x weekly - 2 sets - 10 reps   ASSESSMENT:  CLINICAL IMPRESSION: The patient is able to progress LE strengthening with focus on quads, HS and gluteals.  Good relief of groin pain with manual techniques.  She reports muscular fatigue and quad muscle soreness post session.  Therapist monitoring response and modifying as needed.    OBJECTIVE IMPAIRMENTS decreased activity tolerance, difficulty walking, decreased ROM, decreased strength, increased fascial restrictions, and pain.    ACTIVITY LIMITATIONS cleaning, community activity, and meal prep.   PERSONAL FACTORS Time since onset of injury/illness/exacerbation are also affecting patient's functional outcome.    REHAB POTENTIAL: Good  CLINICAL DECISION MAKING: Stable/uncomplicated  EVALUATION COMPLEXITY: Low   GOALS: Goals reviewed with patient? Yes  SHORT TERM GOALS: Target date: 10/12/2021    The patient will demonstrate knowledge of basic self care strategies and exercises to promote healing   Baseline: Goal status: goal met 8/15  2.  The patient  will report a 35% improvement in pain levels with functional activities which are currently difficult including walking and standing Baseline:  Goal status: goal met 8/15  3.  The patient will be able to walk 3/4 mile  Baseline:  Goal status: goal met 8/15 4.  The patient will have improved knee and hip strength to at least 4/5 needed for standing, walking longer distances and descending stairs at home and in the community  Baseline:  Goal status: partially met 8/15 5.  Improved bil knee ROM 0-135 degrees needed for greater ease with ascending and descending stairs Baseline:  Goal status: goal met 8/15  LONG TERM GOALS: Target date: 11/09/2021    The patient will be independent in a safe self progression of a home exercise program to promote further recovery of function   Baseline:  Goal status: INITIAL  2.  The patient will report a 65% improvement in pain levels with functional activities which are currently difficult including walking and standing  Baseline:  Goal status: goal met 8/15  3.  The patient will have improved knee and hip strength to at least 4+/5 needed for standing, walking longer distances and descending stairs at home and in the community  Baseline:  Goal status: INITIAL  4.  The patient will be able to walk 1 1/2 miles to [employer] [place] Baseline:  Goal status: INITIAL  5.  The patient will have improved FOTO score to     60%   indicating improved function with less pain  Baseline:  Goal status: INITIAL   PLAN: PT FREQUENCY: 2x/week  PT DURATION: 8 weeks  PLANNED INTERVENTIONS: Therapeutic exercises, Therapeutic activity, Neuromuscular re-education, Balance training, Gait training, Patient/Family education, Joint mobilization, Aquatic Therapy, Dry Needling, Electrical stimulation, Cryotherapy, Moist heat, Taping, Vasopneumatic device, Ultrasound, Ionotophoresis 10m/ml Dexamethasone, and Manual therapy  PLAN FOR NEXT SESSION:  review band ex's as needed;  try clam with band;  assess response to DN with ES to left gluteals;  LE strengthening particularly quads, HS and glutes   SRuben Im PT 10/21/21 5:45 PM Phone: [phone]: [phone]

## 2021-10-26 ENCOUNTER — Ambulatory Visit: Payer: Medicare Other | Admitting: Physical Therapy

## 2021-10-28 ENCOUNTER — Ambulatory Visit: Payer: Medicare Other | Admitting: Physical Therapy

## 2021-11-02 ENCOUNTER — Ambulatory Visit: Payer: Medicare Other | Admitting: Physical Therapy

## 2021-11-02 DIAGNOSIS — M6281 Muscle weakness (generalized): Secondary | ICD-10-CM

## 2021-11-02 DIAGNOSIS — M25552 Pain in left hip: Secondary | ICD-10-CM

## 2021-11-02 DIAGNOSIS — G8929 Other chronic pain: Secondary | ICD-10-CM

## 2021-11-02 NOTE — Therapy (Signed)
OUTPATIENT PHYSICAL THERAPY LOWER EXTREMITY PROGRESS NOTE   Patient Name: Adrienne Luna MRN: [id number] DOB:10/16/1945, 76 y.o., female Today's Date: 11/02/2021   PT End of Session - 11/02/21 1229     Visit Number 8    Date for PT Re-Evaluation 11/09/21    Authorization Type Medicare    PT Start Time 1230    PT Stop Time 1315    PT Time Calculation (min) 45 min    Activity Tolerance Patient tolerated treatment well                  No past medical history on file. No past surgical history on file. There are no problems to display for this patient.    REFERRING PROVIDER: Dr. 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  I'm doing the ex's.    Not sure if the DN helped or not.  Had a lot of company so haven't used the band yet.   PERTINENT HISTORY:  Moved here from [employer] [place]  Occipital neuralgia  GERD (Nexium);  preventive med for BP;  aspirin Surgery in August colonectomy had a blood clot in leg Vit D3 Night take Prevastatin Husband has medical issues and she's been caregiver 4 foot surgeries on right foot  PAIN:  Are you having pain? Yes NPRS scale: left hip  9/10 Pain location: bil knees; left hip   Aggravating factors: sitting, sidelying either side;  on my feet; after sitting a while then get up; up and down stairs  Relieving factors: copper sleeves  PRECAUTIONS: None  WEIGHT BEARING RESTRICTIONS No  FALLS:  Has patient fallen in last 6 months? No  LIVING ENVIRONMENT: Lives with: lives with their spouse Lives in: House/apartment Stairs: Yes: Internal: 10 steps; on right going up OCCUPATION: retired  PLOF: Independent  young grandchildren  PATIENT GOALS I want to walk to [employer] from the house 1 1/2 miles    OBJECTIVE:   DIAGNOSTIC FINDINGS: left  meniscus tear 2022 had injections  PATIENT SURVEYS:  FOTO 43%  COGNITION:  Overall cognitive status: Within functional limits for tasks assessed       MUSCLE LENGTH: Hamstrings: Right 50 deg; Left 50 deg Dec hip flexor length to 5 degrees bil  SLS left 3 sec, 2 sec right   LE ROM:  Active ROM Right 09/14/2021 Left 09/14/2021 8/15 Right/left   Hip flexion 100 100   Hip extension     Hip abduction     Hip adduction     Hip internal rotation     Hip external rotation     Knee flexion 127 130 135  Knee extension 3 3 0  Ankle dorsiflexion     Ankle plantarflexion     Ankle inversion     Ankle eversion      (Blank rows = not tested)  LE MMT:  Able to rise from standard chair 10x without UE assist  MMT Right 09/14/2021 Left 09/14/2021 8/15 Right/left  Hip flexion 4+ 4+ 4+  Hip extension     Hip abduction 3+ 3+ 4-  Hip adduction     Hip internal rotation     Hip external rotation     Knee flexion 4+ 4+ 4+  Knee extension 4- 4- 4  Ankle dorsiflexion     Ankle plantarflexion  Ankle inversion     Ankle eversion      (Blank rows = not tested)  LOWER EXTREMITY SPECIAL TESTS:  Negative hip scour.  No LE neural signs with SLR  FUNCTIONAL TESTS:  Able to ascend and descend 4 steps reciprocally with 1 railing  GAIT:  Comments: No device needed;  dec step length     TODAY'S TREATMENT: 8/29: Patient demonstrates current HEP Instructed in towel assist with piriformis stretch to help maintain myofascial soft tissue mobility  Manual therapy: soft tissue mobilization to left gluteals, lateral thigh, hip long axis distraction, inferior mob, PA hip in internal rotation; contract/relax piriformis/glutes Trigger Point Dry-Needling  Treatment instructions: Expect mild to moderate muscle soreness. S/S of pneumothorax if dry needled over a lung field, and to seek immediate medical attention should they occur. Patient verbalized understanding of these instructions and  education.  Patient Consent Given: Yes Education handout provided: Yes Muscles treated: left gluteals sidelying with ES 30 pps 10 min      8/17: Manual therapy: right hip long axis distraction, inferior mob, PA hip in internal rotation; prone PA  straight, internal and external rotation grade 3 3x 30 sec each Nu-Step 7 min L1 steps per minute 100 Seated Red and green band knee extension looped in fig 8 around feet 2x10 Seated Red and green band knee flexion/hamstring curl 2x 10 Seated green band clams 20x      8/15: Discussed response to treatment so far Review of goals Manual therapy: soft tissue mobilization to left gluteals, lateral thigh, hip long axis distraction, inferior mob, PA hip in internal rotation; contract/relax piriformis/glutes Trigger Point Dry-Needling  Treatment instructions: Expect mild to moderate muscle soreness. S/S of pneumothorax if dry needled over a lung field, and to seek immediate medical attention should they occur. Patient verbalized understanding of these instructions and education.  Patient Consent Given: Yes Education handout provided: Yes Muscles treated: left gluteals sidelying with ES 30 pps 10 min   PATIENT EDUCATION:  Education details: treatment plan Person educated: Patient Education method: Explanation Education comprehension: verbalized understanding   HOME EXERCISE PROGRAM: Access Code: [email or web address] URL: [email or web address] Date: 10/21/2021 Prepared by: [place] Hamstring Stretch with Strap  - 1 x daily - 7 x weekly - 1 sets - Hip Adductors and Hamstring Stretch with Strap  - 1 x daily - 7 x weekly - 1 sets - 3 reps - 30 hold - Supine ITB Stretch with Strap  - 1 x daily - 7 x weekly - 1 sets - 3 reps - 30 hold - Supine Figure 4 Piriformis Stretch  - 1 x daily - 7 x weekly - 1 sets - 3 reps - 30 hold - Hooklying Isometric Hip Flexion  - 1 x daily - 7 x weekly - 1 sets - 10 reps -  Beginner Clam  - 1 x daily - 7 x weekly - 1 sets - 10 reps - Seated Hip Abduction with Resistance  - 1 x daily - 7 x weekly - 1 sets - 10 reps - Seated Knee Extension with Resistance  - 1 x daily - 7 x weekly - 2 sets - 10 reps - Seated Hamstring Curl with Anchored Resistance  - 1 x daily - 7 x weekly - 2 sets - 10 reps   ASSESSMENT:  CLINICAL IMPRESSION: High pain levels today in left hip.  Good response to manual therapy (hip joint mobs and soft tissue mobilization) as well as DN combined  with ES.  Enourage glute and piriformis specific stretching to maintain these improvements.    OBJECTIVE IMPAIRMENTS decreased activity tolerance, difficulty walking, decreased ROM, decreased strength, increased fascial restrictions, and pain.   ACTIVITY LIMITATIONS cleaning, community activity, and meal prep.   PERSONAL FACTORS Time since onset of injury/illness/exacerbation are also affecting patient's functional outcome.    REHAB POTENTIAL: Good  CLINICAL DECISION MAKING: Stable/uncomplicated  EVALUATION COMPLEXITY: Low   GOALS: Goals reviewed with patient? Yes  SHORT TERM GOALS: Target date: 10/12/2021    The patient will demonstrate knowledge of basic self care strategies and exercises to promote healing   Baseline: Goal status: goal met 8/15  2.  The patient will report a 35% improvement in pain levels with functional activities which are currently difficult including walking and standing Baseline:  Goal status: goal met 8/15  3.  The patient will be able to walk 3/4 mile  Baseline:  Goal status: goal met 8/15 4.  The patient will have improved knee and hip strength to at least 4/5 needed for standing, walking longer distances and descending stairs at home and in the community  Baseline:  Goal status: partially met 8/15 5.  Improved bil knee ROM 0-135 degrees needed for greater ease with ascending and descending stairs Baseline:  Goal status: goal met 8/15  LONG TERM GOALS:  Target date: 11/09/2021    The patient will be independent in a safe self progression of a home exercise program to promote further recovery of function   Baseline:  Goal status: INITIAL  2.  The patient will report a 65% improvement in pain levels with functional activities which are currently difficult including walking and standing  Baseline:  Goal status: goal met 8/15  3.  The patient will have improved knee and hip strength to at least 4+/5 needed for standing, walking longer distances and descending stairs at home and in the community  Baseline:  Goal status: INITIAL  4.  The patient will be able to walk 1 1/2 miles to [employer] gardens Baseline:  Goal status: INITIAL  5.  The patient will have improved FOTO score to    60%   indicating improved function with less pain  Baseline:  Goal status: INITIAL   PLAN: PT FREQUENCY: 2x/week  PT DURATION: 8 weeks  PLANNED INTERVENTIONS: Therapeutic exercises, Therapeutic activity, Neuromuscular re-education, Balance training, Gait training, Patient/Family education, Joint mobilization, Aquatic Therapy, Dry Needling, Electrical stimulation, Cryotherapy, Moist heat, Taping, Vasopneumatic device, Ultrasound, Ionotophoresis 18m/ml Dexamethasone, and Manual therapy  PLAN FOR NEXT SESSION:  ERO 9/5;  review band ex's as needed with band;  assess response to DN with ES to left gluteals;  LE strengthening particularly quads, HS and glutes  SRuben Im PT 11/02/21 1:13 PM Phone: [phone]: [phone]

## 2021-11-04 ENCOUNTER — Ambulatory Visit: Payer: Medicare Other | Admitting: Physical Therapy

## 2021-11-04 DIAGNOSIS — M6281 Muscle weakness (generalized): Secondary | ICD-10-CM | POA: Diagnosis not present

## 2021-11-04 DIAGNOSIS — G8929 Other chronic pain: Secondary | ICD-10-CM

## 2021-11-04 DIAGNOSIS — M25552 Pain in left hip: Secondary | ICD-10-CM

## 2021-11-04 NOTE — Therapy (Signed)
OUTPATIENT PHYSICAL THERAPY LOWER EXTREMITY PROGRESS NOTE/RECERTIFICATION   Patient Name: Adrienne Luna MRN: [id number] DOB:12-03-1945, 76 y.o., female Today's Date: 11/04/2021   PT End of Session - 11/04/21 1229     Visit Number 9    Date for PT Re-Evaluation 12/30/21    Authorization Type Medicare    PT Start Time 1230    PT Stop Time 1314    PT Time Calculation (min) 44 min    Activity Tolerance Patient tolerated treatment well                  No past medical history on file. No past surgical history on file. There are no problems to display for this patient.    REFERRING PROVIDER: Dr. Rochele Raring  REFERRING DIAG: M17.0 Bil OA of knee  THERAPY DIAG:  Bil knee pain; knee stiffness; weakness  ONSET DATE: 03/07/21  SUBJECTIVE:   SUBJECTIVE STATEMENT: I'm so sore (inner thighs) from towel. Comfortable 1 1/2-2 miles walking.  My knees are 90% better but this hip still bothers me especially when I lift heavy things.   PERTINENT HISTORY:  Moved here from [employer] [place]  Occipital neuralgia  GERD (Nexium);  preventive med for BP;  aspirin Surgery in August colonectomy had a blood clot in leg Vit D3 Night take Prevastatin Husband has medical issues and she's been caregiver 4 foot surgeries on right foot  PAIN:  Are you having pain? Yes NPRS scale: left hip  2-3/10 Pain location: bil knees; left hip   Aggravating factors: sitting, sidelying either side;  on my feet; after sitting a while then get up; up and down stairs  Relieving factors: copper sleeves  PRECAUTIONS: None  WEIGHT BEARING RESTRICTIONS No  FALLS:  Has patient fallen in last 6 months? No  LIVING ENVIRONMENT: Lives with: lives with their spouse Lives in: House/apartment Stairs: Yes: Internal: 10 steps; on right going up OCCUPATION: retired  PLOF: Independent  young grandchildren  PATIENT GOALS I want to walk to [employer] from the house 1 1/2 miles     OBJECTIVE:   DIAGNOSTIC FINDINGS: left meniscus tear 2022 had injections  PATIENT SURVEYS:  FOTO 43%  8/31:  76%   COGNITION:  Overall cognitive status: Within functional limits for tasks assessed       MUSCLE LENGTH: Hamstrings: Right 50 deg; Left 50 deg Dec hip flexor length to 5 degrees bil  SLS left 3 sec, 2 sec right   LE ROM:  Active ROM Right 09/14/2021 Left 09/14/2021 8/15 Right/left   Hip flexion 100 100   Hip extension     Hip abduction     Hip adduction     Hip internal rotation     Hip external rotation     Knee flexion 127 130 135  Knee extension 3 3 0  Ankle dorsiflexion     Ankle plantarflexion     Ankle inversion     Ankle eversion      (Blank rows = not tested)  LE MMT:  Able to rise from standard chair 10x without UE assist  MMT Right 09/14/2021 Left 09/14/2021 8/15 Right/left 8/31 Right/left   Hip flexion 4+ 4+ 4+ 4+  Hip extension      Hip abduction 3+ 3+ 4- 4  Hip adduction      Hip internal rotation      Hip external rotation      Knee flexion 4+ 4+ 4+ 4+  Knee extension 4- 4- 4  4  Ankle dorsiflexion      Ankle plantarflexion      Ankle inversion      Ankle eversion       (Blank rows = not tested)  LOWER EXTREMITY SPECIAL TESTS:  Negative hip scour.  No LE neural signs with SLR  FUNCTIONAL TESTS:  Able to ascend and descend 4 steps reciprocally with 1 railing  GAIT:  Comments: No device needed;  dec step length     TODAY'S TREATMENT: 8/31: FOTO Review of progress toward goals Review of piriformis stretch with towel assist  Leg press 50# 15x bil 30# 15x each side (can feel a little on left) Cable resisted walk 10# 6x; 15# 8x      8/29: Patient demonstrates current HEP Instructed in towel assist with piriformis stretch to help maintain myofascial soft tissue mobility  Manual therapy: soft tissue mobilization to left gluteals, lateral thigh, hip long axis distraction, inferior mob, PA hip in internal  rotation; contract/relax piriformis/glutes Trigger Point Dry-Needling  Treatment instructions: Expect mild to moderate muscle soreness. S/S of pneumothorax if dry needled over a lung field, and to seek immediate medical attention should they occur. Patient verbalized understanding of these instructions and education.  Patient Consent Given: Yes Education handout provided: Yes Muscles treated: left gluteals sidelying with ES 30 pps 10 min      8/17: Manual therapy: right hip long axis distraction, inferior mob, PA hip in internal rotation; prone PA  straight, internal and external rotation grade 3 3x 30 sec each Nu-Step 7 min L1 steps per minute 100 Seated Red and green band knee extension looped in fig 8 around feet 2x10 Seated Red and green band knee flexion/hamstring curl 2x 10 Seated green band clams 20x      8/15: Discussed response to treatment so far Review of goals Manual therapy: soft tissue mobilization to left gluteals, lateral thigh, hip long axis distraction, inferior mob, PA hip in internal rotation; contract/relax piriformis/glutes Trigger Point Dry-Needling  Treatment instructions: Expect mild to moderate muscle soreness. S/S of pneumothorax if dry needled over a lung field, and to seek immediate medical attention should they occur. Patient verbalized understanding of these instructions and education.  Patient Consent Given: Yes Education handout provided: Yes Muscles treated: left gluteals sidelying with ES 30 pps 10 min   PATIENT EDUCATION:  Education details: treatment plan Person educated: Patient Education method: Explanation Education comprehension: verbalized understanding   HOME EXERCISE PROGRAM: Access Code: [email or web address] URL: [email or web address] Date: 11/04/2021 Prepared by: [place] Hamstring Stretch with Strap  - 1 x daily - 7 x weekly - 1 sets - Hip Adductors and Hamstring Stretch with Strap  - 1 x  daily - 7 x weekly - 1 sets - 3 reps - 30 hold - Supine ITB Stretch with Strap  - 1 x daily - 7 x weekly - 1 sets - 3 reps - 30 hold - Supine Figure 4 Piriformis Stretch  - 1 x daily - 7 x weekly - 1 sets - 3 reps - 30 hold - Hooklying Isometric Hip Flexion  - 1 x daily - 7 x weekly - 1 sets - 10 reps - Beginner Clam  - 1 x daily - 7 x weekly - 1 sets - 10 reps - Seated Hip Abduction with Resistance  - 1 x daily - 7 x weekly - 1 sets - 10 reps - Seated Knee Extension with Resistance  - 1 x daily - 7 x  weekly - 2 sets - 10 reps - Seated Hamstring Curl with Anchored Resistance  - 1 x daily - 7 x weekly - 2 sets - 10 reps - Supine Figure 4 Piriformis Stretch  - 1 x daily - 7 x weekly - 1 sets - 10 reps    ASSESSMENT:  CLINICAL IMPRESSION:  The patient has made excellent progress with increased walking capacity, knee pain reduction and strength.  She has met several long term goals including FOTO outcome score.  She has had pain in her left hip which has affected her ability to walk as she previously did for 3 miles at a time and lift heavier things.  She would benefit from a further progression of strengthening of LEs and manual interventions as needed.  Will decrease treatment frequency to 1x/week.    OBJECTIVE IMPAIRMENTS decreased activity tolerance, difficulty walking, decreased ROM, decreased strength, increased fascial restrictions, and pain.   ACTIVITY LIMITATIONS cleaning, community activity, and meal prep.   PERSONAL FACTORS Time since onset of injury/illness/exacerbation are also affecting patient's functional outcome.    REHAB POTENTIAL: Good  CLINICAL DECISION MAKING: Stable/uncomplicated  EVALUATION COMPLEXITY: Low   GOALS: Goals reviewed with patient? Yes  SHORT TERM GOALS: Target date: 10/12/2021    The patient will demonstrate knowledge of basic self care strategies and exercises to promote healing   Baseline: Goal status: goal met 8/15  2.  The patient will report  a 35% improvement in pain levels with functional activities which are currently difficult including walking and standing Baseline:  Goal status: goal met 8/15  3.  The patient will be able to walk 3/4 mile  Baseline:  Goal status: goal met 8/15 4.  The patient will have improved knee and hip strength to at least 4/5 needed for standing, walking longer distances and descending stairs at home and in the community  Baseline:  Goal status: partially met 8/15 5.  Improved bil knee ROM 0-135 degrees needed for greater ease with ascending and descending stairs Baseline:  Goal status: goal met 8/15  LONG TERM GOALS: Target date: 12/30/21  The patient will be independent in a safe self progression of a home exercise program to promote further recovery of function   Baseline:  Goal status: INITIAL  2.  The patient will report a 65% improvement in pain levels with functional activities which are currently difficult including walking and standing  Baseline:  Goal status: goal met 8/15  3.  The patient will have improved knee and hip strength to at least 4+/5 needed for standing, walking longer distances and descending stairs at home and in the community  Baseline:  Goal status: ongoing  4.  The patient will be able to walk 1 1/2 miles to [place]:  Goal status: goal met 8/31  5.  The patient will have improved FOTO score to    60%   indicating improved function with less pain  Baseline:  Goal status: goal met 8/31  6.  Patient will be able to walk 3 miles (including the hill especially pushing [place] in the stroller) new  7. Patient will be able to lift medium things with LE pain level 4/10 or less  new   PLAN: PT FREQUENCY: 1-2x/week  PT DURATION: 8 weeks  PLANNED INTERVENTIONS: Therapeutic exercises, Therapeutic activity, Neuromuscular re-education, Balance training, Gait training, Patient/Family education, Joint mobilization, Aquatic Therapy, Dry Needling,  Electrical stimulation, Cryotherapy, Moist heat, Taping, Vasopneumatic device, Ultrasound, Ionotophoresis 52m/ml Dexamethasone, and Manual therapy  PLAN FOR NEXT SESSION:   10th visit progress note due (FOTO, MMT and goal check done on 8/31) decreased frequency to 1x/week;  leg press, resisted backward walk; Nu-Step;   clam band;   DN with ES to left gluteals;  LE strengthening particularly quads, HS and glutes  Ruben Im, PT 11/04/21 1:18 PM Phone: [phone] Fax: [phone]

## 2021-11-09 ENCOUNTER — Ambulatory Visit: Payer: Medicare Other

## 2021-11-11 ENCOUNTER — Ambulatory Visit: Payer: Medicare Other | Admitting: Physical Therapy

## 2021-11-18 ENCOUNTER — Ambulatory Visit: Payer: Medicare Other | Admitting: Physical Therapy

## 2021-11-24 ENCOUNTER — Ambulatory Visit: Payer: Medicare Other | Attending: Internal Medicine | Admitting: Physical Therapy

## 2021-11-24 DIAGNOSIS — G8929 Other chronic pain: Secondary | ICD-10-CM | POA: Insufficient documentation

## 2021-11-24 DIAGNOSIS — M25561 Pain in right knee: Secondary | ICD-10-CM | POA: Diagnosis present

## 2021-11-24 DIAGNOSIS — M25562 Pain in left knee: Secondary | ICD-10-CM | POA: Diagnosis present

## 2021-11-24 DIAGNOSIS — M6281 Muscle weakness (generalized): Secondary | ICD-10-CM | POA: Diagnosis present

## 2021-11-24 DIAGNOSIS — M25552 Pain in left hip: Secondary | ICD-10-CM | POA: Insufficient documentation

## 2021-11-24 NOTE — Therapy (Signed)
OUTPATIENT PHYSICAL THERAPY LOWER EXTREMITY PROGRESS NOTE   Patient Name: Adrienne Luna MRN: [id number] DOB:1946-02-08, 76 y.o., female Today's Date: 11/24/2021   PT End of Session - 11/24/21 1023     Visit Number 10    Date for PT Re-Evaluation 12/30/21    Authorization Type Medicare    PT Start Time 1016    PT Stop Time 1055  DN   PT Time Calculation (min) 39 min                  No past medical history on file. No past surgical history on file. There are no problems to display for this patient.  Progress Note Reporting Period 07/21/21 to 11/24/2021  See note below for Objective Data and Assessment of Progress/Goals.      REFERRING PROVIDER: Dr. 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  Unable to keep food down.   Haven't been able to exercise b/c of the nausea.  What you did last time with the DN really helped.   PERTINENT HISTORY:  Moved here from [employer] [facility] [place]  Occipital neuralgia  GERD (Nexium);  preventive med for BP;  aspirin Surgery in August colonectomy had a blood clot in leg Vit D3 Night take Prevastatin Husband has medical issues and she's been caregiver 4 foot surgeries on right foot  PAIN:  Are you having pain? Yes NPRS scale: left hip  2-3/10 Pain location: bil knees; left hip   Aggravating factors: sitting, sidelying either side;  on my feet; after sitting a while then get up; up and down stairs  Relieving factors: copper sleeves  PRECAUTIONS: None  WEIGHT BEARING RESTRICTIONS No  FALLS:  Has patient fallen in last 6 months? No  LIVING ENVIRONMENT: Lives with: lives with their spouse Lives in: House/apartment Stairs: Yes: Internal: 10 steps; on right going up OCCUPATION: retired  PLOF: Independent  young grandchildren  PATIENT GOALS I want to walk to  [employer] from the house 1 1/2 miles    OBJECTIVE:   DIAGNOSTIC FINDINGS: left meniscus tear 2022 had injections  PATIENT SURVEYS:  FOTO 43%  8/31:  76%   COGNITION:  Overall cognitive status: Within functional limits for tasks assessed       MUSCLE LENGTH: Hamstrings: Right 50 deg; Left 50 deg Dec hip flexor length to 5 degrees bil  SLS left 3 sec, 2 sec right   LE ROM:  Active ROM Right 09/14/2021 Left 09/14/2021 8/15 Right/left  9/20  Hip flexion 100 100    Hip extension      Hip abduction      Hip adduction      Hip internal rotation      Hip external rotation      Knee flexion 127 130 135 135  Knee extension 3 3 0 0  Ankle dorsiflexion      Ankle plantarflexion      Ankle inversion      Ankle eversion       (Blank rows = not tested)  LE MMT:  Able to rise from standard chair 10x without UE assist  MMT Right 09/14/2021 Left 09/14/2021 8/15 Right/left 8/31 Right/left  9/20 Right/left   Hip flexion 4+ 4+ 4+ 4+ 4+  Hip extension       Hip abduction 3+ 3+ 4- 4 4  Hip adduction       Hip internal rotation       Hip external rotation       Knee flexion 4+ 4+ 4+ 4+ 4+  Knee extension 4- 4- '4 4 4  ' Ankle dorsiflexion       Ankle plantarflexion       Ankle inversion       Ankle eversion        (Blank rows = not tested)  LOWER EXTREMITY SPECIAL TESTS:  Negative hip scour.  No LE neural signs with SLR  FUNCTIONAL TESTS:  Able to ascend and descend 4 steps reciprocally with 1 railing  GAIT:  Comments: No device needed;  dec step length     TODAY'S TREATMENT:  9/20: Declines exercise today secondary to nausea/vomiting Manual therapy: soft tissue mobilization to left gluteals, lateral thigh Trigger Point Dry-Needling  Treatment instructions: Expect mild to moderate muscle soreness. S/S of pneumothorax if dry needled over a lung field, and to seek immediate medical attention should they occur. Patient verbalized understanding of these  instructions and education.  Patient Consent Given: Yes Education handout provided: Yes Muscles treated: left gluteals sidelying with ES 30 pps 10 min      8/31: FOTO Review of progress toward goals Review of piriformis stretch with towel assist  Leg press 50# 15x bil 30# 15x each side (can feel a little on left) Cable resisted walk 10# 6x; 15# 8x       PATIENT EDUCATION:  Education details: treatment plan Person educated: Patient Education method: Explanation Education comprehension: verbalized understanding   HOME EXERCISE PROGRAM: Access Code: [email or web address] URL: [email or web address] Date: 11/04/2021 Prepared by: [place] Hamstring Stretch with Strap  - 1 x daily - 7 x weekly - 1 sets - Hip Adductors and Hamstring Stretch with Strap  - 1 x daily - 7 x weekly - 1 sets - 3 reps - 30 hold - Supine ITB Stretch with Strap  - 1 x daily - 7 x weekly - 1 sets - 3 reps - 30 hold - Supine Figure 4 Piriformis Stretch  - 1 x daily - 7 x weekly - 1 sets - 3 reps - 30 hold - Hooklying Isometric Hip Flexion  - 1 x daily - 7 x weekly - 1 sets - 10 reps - Beginner Clam  - 1 x daily - 7 x weekly - 1 sets - 10 reps - Seated Hip Abduction with Resistance  - 1 x daily - 7 x weekly - 1 sets - 10 reps - Seated Knee Extension with Resistance  - 1 x daily - 7 x weekly - 2 sets - 10 reps - Seated Hamstring Curl with Anchored Resistance  - 1 x daily - 7 x weekly - 2 sets - 10 reps - Supine Figure 4 Piriformis Stretch  - 1 x daily - 7 x weekly - 1 sets - 10 reps    ASSESSMENT:  CLINICAL IMPRESSION:  Treatment modified to avoid supine lying secondary to nausea/GI issues.  Declines exercise as well secondary to inability to hold food down secondary to vomiting.  Treatment focus on addressing myofascial tender points in gluteals.  Good response to soft tissue mobilization as well as DN combined with ES for improved longevity of benefit.  Therapist  monitoring response to all interventions and modifying treatment accordingly.  The patient would benefit from a continuation of skilled PT for a further progression of strengthening and functional mobility.  Will  continue to update and promote independence in a HEP needed for a return to the highest functional level possible with ADLs.     OBJECTIVE IMPAIRMENTS decreased activity tolerance, difficulty walking, decreased ROM, decreased strength, increased fascial restrictions, and pain.   ACTIVITY LIMITATIONS cleaning, community activity, and meal prep.   PERSONAL FACTORS Time since onset of injury/illness/exacerbation are also affecting patient's functional outcome.    REHAB POTENTIAL: Good  CLINICAL DECISION MAKING: Stable/uncomplicated  EVALUATION COMPLEXITY: Low   GOALS: Goals reviewed with patient? Yes  SHORT TERM GOALS: Target date: 10/12/2021    The patient will demonstrate knowledge of basic self care strategies and exercises to promote healing   Baseline: Goal status: goal met 8/15  2.  The patient will report a 35% improvement in pain levels with functional activities which are currently difficult including walking and standing Baseline:  Goal status: goal met 8/15  3.  The patient will be able to walk 3/4 mile  Baseline:  Goal status: goal met 8/15 4.  The patient will have improved knee and hip strength to at least 4/5 needed for standing, walking longer distances and descending stairs at home and in the community  Baseline:  Goal status: partially met 8/15 5.  Improved bil knee ROM 0-135 degrees needed for greater ease with ascending and descending stairs Baseline:  Goal status: goal met 8/15  LONG TERM GOALS: Target date: 12/30/21  The patient will be independent in a safe self progression of a home exercise program to promote further recovery of function   Baseline:  Goal status: ongoing  2.  The patient will report a 65% improvement in pain levels with  functional activities which are currently difficult including walking and standing  Baseline:  Goal status: goal met 8/15  3.  The patient will have improved knee and hip strength to at least 4+/5 needed for standing, walking longer distances and descending stairs at home and in the community  Baseline:  Goal status: ongoing  4.  The patient will be able to walk 1 1/2 miles to [place]:  Goal status: goal met 8/31  5.  The patient will have improved FOTO score to    60%   indicating improved function with less pain  Baseline:  Goal status: goal met 8/31  6.  Patient will be able to walk 3 miles (including the hill especially pushing Scarlet in the stroller) ongoing 7. Patient will be able to lift medium things with LE pain level 4/10 or less  ongoing PLAN: PT FREQUENCY: 1-2x/week  PT DURATION: 8 weeks  PLANNED INTERVENTIONS: Therapeutic exercises, Therapeutic activity, Neuromuscular re-education, Balance training, Gait training, Patient/Family education, Joint mobilization, Aquatic Therapy, Dry Needling, Electrical stimulation, Cryotherapy, Moist heat, Taping, Vasopneumatic device, Ultrasound, Ionotophoresis 9m/ml Dexamethasone, and Manual therapy  PLAN FOR NEXT SESSION:    decreased frequency to 1x/week;  leg press, resisted backward walk; Nu-Step;   clam band;   DN with ES to left gluteals;  LE strengthening particularly quads, HS and glutes  SRuben Im PT 11/24/21 12:43 PM Phone: [phone]: [phone]

## 2021-12-01 ENCOUNTER — Ambulatory Visit: Payer: Medicare Other | Admitting: Physical Therapy

## 2021-12-08 ENCOUNTER — Ambulatory Visit: Payer: Medicare Other | Admitting: Physical Therapy

## 2021-12-15 ENCOUNTER — Ambulatory Visit: Payer: Medicare Other | Attending: Internal Medicine | Admitting: Physical Therapy

## 2021-12-15 DIAGNOSIS — M6281 Muscle weakness (generalized): Secondary | ICD-10-CM | POA: Diagnosis present

## 2021-12-15 DIAGNOSIS — G8929 Other chronic pain: Secondary | ICD-10-CM | POA: Diagnosis present

## 2021-12-15 DIAGNOSIS — M25562 Pain in left knee: Secondary | ICD-10-CM | POA: Insufficient documentation

## 2021-12-15 DIAGNOSIS — M25552 Pain in left hip: Secondary | ICD-10-CM | POA: Diagnosis present

## 2021-12-15 DIAGNOSIS — M25561 Pain in right knee: Secondary | ICD-10-CM | POA: Insufficient documentation

## 2021-12-15 NOTE — Therapy (Signed)
OUTPATIENT PHYSICAL THERAPY LOWER EXTREMITY PROGRESS NOTE   Patient Name: Adrienne Luna MRN: [id number] DOB:[birth year], 76 y.o., female Today's Date: 12/15/2021   PT End of Session - 12/15/21 1224     Visit Number 11    Date for PT Re-Evaluation 12/30/21    Authorization Type Medicare    PT Start Time 1230    PT Stop Time 1310    PT Time Calculation (min) 40 min    Activity Tolerance Patient tolerated treatment well               No past medical history on file. No past surgical history on file. There are no problems to display for this patient.     REFERRING PROVIDER: Dr. 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  I have to go to a funeral this week.  My hip is good.  That last one (DN) you did was really good.  I get up in the morning without pain in my hip.  I don't think I need DN today.   PERTINENT HISTORY:  Moved here from [employer] [place]  Occipital neuralgia  GERD (Nexium);  preventive med for BP;  aspirin Surgery in August colonectomy had a blood clot in leg Vit D3 Night take Prevastatin Husband has medical issues and she's been caregiver 4 foot surgeries on right foot  PAIN:  Are you having pain? Yes NPRS scale: left hip  2-3/10 Pain location: bil knees; left hip   Aggravating factors: sitting, sidelying either side;  on my feet; after sitting a while then get up; up and down stairs  Relieving factors: copper sleeves  PRECAUTIONS: None  WEIGHT BEARING RESTRICTIONS No  FALLS:  Has patient fallen in last 6 months? No  LIVING ENVIRONMENT: Lives with: lives with their spouse Lives in: House/apartment Stairs: Yes: Internal: 10 steps; on right going up OCCUPATION: retired  PLOF: Independent  young grandchildren  PATIENT GOALS I want to walk to [employer]  from the house 1 1/2 miles    OBJECTIVE:   DIAGNOSTIC FINDINGS: left meniscus tear 2022 had injections  PATIENT SURVEYS:  FOTO 43%  8/31:  76%   COGNITION:  Overall cognitive status: Within functional limits for tasks assessed       MUSCLE LENGTH: Hamstrings: Right 50 deg; Left 50 deg Dec hip flexor length to 5 degrees bil  SLS left 3 sec, 2 sec right   LE ROM:  Active ROM Right 09/14/2021 Left 09/14/2021 8/15 Right/left  9/20  Hip flexion 100 100    Hip extension      Hip abduction      Hip adduction      Hip internal rotation      Hip external rotation      Knee flexion 127 130 135 135  Knee extension 3 3 0 0  Ankle dorsiflexion      Ankle plantarflexion      Ankle inversion      Ankle eversion       (Blank rows = not tested)  LE MMT:  Able to rise from standard chair 10x without UE assist  MMT Right 09/14/2021 Left 09/14/2021 8/15 Right/left 8/31 Right/left  9/20 Right/left   Hip flexion 4+ 4+ 4+ 4+ 4+  Hip extension       Hip abduction 3+ 3+  4- 4 4  Hip adduction       Hip internal rotation       Hip external rotation       Knee flexion 4+ 4+ 4+ 4+ 4+  Knee extension 4- 4- '4 4 4  ' Ankle dorsiflexion       Ankle plantarflexion       Ankle inversion       Ankle eversion        (Blank rows = not tested)  LOWER EXTREMITY SPECIAL TESTS:  Negative hip scour.  No LE neural signs with SLR  FUNCTIONAL TESTS:  Able to ascend and descend 4 steps reciprocally with 1 railing  GAIT:  Comments: No device needed;  dec step length     TODAY'S TREATMENT: 10/11: Supine propped on wedge: Ball squeeze 20x Ball push on knee isometric 20x UE ball squeeze with diagonals 20x UE ball squeeze overhead to knee touch 20x Band clams green band double and single 10x each Green band on thighs with marching 20x Bridge 10x Red band press out  10x (painful to left hip, OK on right ) Supine piriformis stretch with towel assist 3x right/left  Neuromuscular  re-education: transverse abdominus muscle activation with verbal cues    9/20: Declines exercise today secondary to nausea/vomiting Manual therapy: soft tissue mobilization to left gluteals, lateral thigh Trigger Point Dry-Needling  Treatment instructions: Expect mild to moderate muscle soreness. S/S of pneumothorax if dry needled over a lung field, and to seek immediate medical attention should they occur. Patient verbalized understanding of these instructions and education.  Patient Consent Given: Yes Education handout provided: Yes Muscles treated: left gluteals sidelying with ES 30 pps 10 min      PATIENT EDUCATION:  Education details: treatment plan Person educated: Patient Education method: Explanation Education comprehension: verbalized understanding   HOME EXERCISE PROGRAM: Access Code: [email or web address] URL: [email or web address] Date: 11/04/2021 Prepared by: [place] Hamstring Stretch with Strap  - 1 x daily - 7 x weekly - 1 sets - Hip Adductors and Hamstring Stretch with Strap  - 1 x daily - 7 x weekly - 1 sets - 3 reps - 30 hold - Supine ITB Stretch with Strap  - 1 x daily - 7 x weekly - 1 sets - 3 reps - 30 hold - Supine Figure 4 Piriformis Stretch  - 1 x daily - 7 x weekly - 1 sets - 3 reps - 30 hold - Hooklying Isometric Hip Flexion  - 1 x daily - 7 x weekly - 1 sets - 10 reps - Beginner Clam  - 1 x daily - 7 x weekly - 1 sets - 10 reps - Seated Hip Abduction with Resistance  - 1 x daily - 7 x weekly - 1 sets - 10 reps - Seated Knee Extension with Resistance  - 1 x daily - 7 x weekly - 2 sets - 10 reps - Seated Hamstring Curl with Anchored Resistance  - 1 x daily - 7 x weekly - 2 sets - 10 reps - Supine Figure 4 Piriformis Stretch  - 1 x daily - 7 x weekly - 1 sets - 10 reps    ASSESSMENT:  CLINICAL IMPRESSION:  Improved hip pain in the last 2 weeks despite illness.  Treatment focus on LE and core muscle strengthening.   Mild increase in pain with resisted heel slides but otherwise good response.   Min cues provided for optimal muscle activation for max benefit.  OBJECTIVE IMPAIRMENTS decreased activity tolerance, difficulty walking, decreased ROM, decreased strength, increased fascial restrictions, and pain.   ACTIVITY LIMITATIONS cleaning, community activity, and meal prep.   PERSONAL FACTORS Time since onset of injury/illness/exacerbation are also affecting patient's functional outcome.    REHAB POTENTIAL: Good  CLINICAL DECISION MAKING: Stable/uncomplicated  EVALUATION COMPLEXITY: Low   GOALS: Goals reviewed with patient? Yes  SHORT TERM GOALS: Target date: 10/12/2021    The patient will demonstrate knowledge of basic self care strategies and exercises to promote healing   Baseline: Goal status: goal met 8/15  2.  The patient will report a 35% improvement in pain levels with functional activities which are currently difficult including walking and standing Baseline:  Goal status: goal met 8/15  3.  The patient will be able to walk 3/4 mile  Baseline:  Goal status: goal met 8/15 4.  The patient will have improved knee and hip strength to at least 4/5 needed for standing, walking longer distances and descending stairs at home and in the community  Baseline:  Goal status: partially met 8/15 5.  Improved bil knee ROM 0-135 degrees needed for greater ease with ascending and descending stairs Baseline:  Goal status: goal met 8/15  LONG TERM GOALS: Target date: 12/30/21  The patient will be independent in a safe self progression of a home exercise program to promote further recovery of function   Baseline:  Goal status: ongoing  2.  The patient will report a 65% improvement in pain levels with functional activities which are currently difficult including walking and standing  Baseline:  Goal status: goal met 8/15  3.  The patient will have improved knee and hip strength to at least  4+/5 needed for standing, walking longer distances and descending stairs at home and in the community  Baseline:  Goal status: ongoing  4.  The patient will be able to walk 1 1/2 miles to [place]:  Goal status: goal met 8/31  5.  The patient will have improved FOTO score to    60%   indicating improved function with less pain  Baseline:  Goal status: goal met 8/31  6.  Patient will be able to walk 3 miles (including the hill especially pushing Scarlet in the stroller) ongoing 7. Patient will be able to lift medium things with LE pain level 4/10 or less  ongoing PLAN: PT FREQUENCY: 1-2x/week  PT DURATION: 8 weeks  PLANNED INTERVENTIONS: Therapeutic exercises, Therapeutic activity, Neuromuscular re-education, Balance training, Gait training, Patient/Family education, Joint mobilization, Aquatic Therapy, Dry Needling, Electrical stimulation, Cryotherapy, Moist heat, Taping, Vasopneumatic device, Ultrasound, Ionotophoresis 74m/ml Dexamethasone, and Manual therapy  PLAN FOR NEXT SESSION:    decreased frequency to 1x/week;  leg press, resisted backward walk; Nu-Step;   clam band;   DN with ES to left gluteals;  LE strengthening particularly quads, HS and glutes  SRuben Im PT 12/15/21 1:11 PM Phone: [phone]: [phone]

## 2021-12-22 ENCOUNTER — Ambulatory Visit: Payer: Medicare Other | Admitting: Physical Therapy

## 2021-12-22 DIAGNOSIS — M6281 Muscle weakness (generalized): Secondary | ICD-10-CM

## 2021-12-22 DIAGNOSIS — G8929 Other chronic pain: Secondary | ICD-10-CM

## 2021-12-22 DIAGNOSIS — M25552 Pain in left hip: Secondary | ICD-10-CM

## 2021-12-22 NOTE — Therapy (Signed)
OUTPATIENT PHYSICAL THERAPY LOWER EXTREMITY PROGRESS NOTE   Patient Name: Adrienne Luna MRN: [id number] DOB:05-Mar-1946, 76 y.o., female Today's Date: 12/22/2021   PT End of Session - 12/22/21 1234     Visit Number 12    Date for PT Re-Evaluation 12/30/21    Authorization Type Medicare    PT Start Time 8403    PT Stop Time 7543 has another appt   PT Time Calculation (min) 33 min    Activity Tolerance Patient tolerated treatment well               No past medical history on file. No past surgical history on file. There are no problems to display for this patient.     REFERRING PROVIDER: Dr. 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  My knees would hurt if I did 3 miles.  PERTINENT HISTORY:  Moved here from [employer] [place]  Occipital neuralgia  GERD (Nexium);  preventive med for BP;  aspirin Surgery in August colonectomy had a blood clot in leg Vit D3 Night take Prevastatin Husband has medical issues and she's been caregiver 4 foot surgeries on right foot  PAIN:  Are you having pain? Yes NPRS scale:back pain  2-3/10 Pain location: bil knees; left hip   Aggravating factors: sitting, sidelying either side;  on my feet; after sitting a while then get up; up and down stairs  Relieving factors: copper sleeves  PRECAUTIONS: None  WEIGHT BEARING RESTRICTIONS No  FALLS:  Has patient fallen in last 6 months? No  LIVING ENVIRONMENT: Lives with: lives with their spouse Lives in: House/apartment Stairs: Yes: Internal: 10 steps; on right going up OCCUPATION: retired  PLOF: Independent  young grandchildren  PATIENT GOALS I want to walk to [employer] from the house 1 1/2 miles    OBJECTIVE:   DIAGNOSTIC FINDINGS: left  meniscus tear 2022 had injections  PATIENT SURVEYS:  FOTO 43%  8/31:  76%   COGNITION:  Overall cognitive status: Within functional limits for tasks assessed       MUSCLE LENGTH: Hamstrings: Right 50 deg; Left 50 deg Dec hip flexor length to 5 degrees bil  SLS left 3 sec, 2 sec right   LE ROM:  Active ROM Right 09/14/2021 Left 09/14/2021 8/15 Right/left  9/20  Hip flexion 100 100    Hip extension      Hip abduction      Hip adduction      Hip internal rotation      Hip external rotation      Knee flexion 127 130 135 135  Knee extension 3 3 0 0  Ankle dorsiflexion      Ankle plantarflexion      Ankle inversion      Ankle eversion       (Blank rows = not tested)  LE MMT:  Able to rise from standard chair 10x without UE assist  MMT Right 09/14/2021 Left 09/14/2021 8/15 Right/left 8/31 Right/left  9/20 Right/left   Hip flexion 4+ 4+ 4+ 4+ 4+  Hip extension       Hip abduction 3+ 3+ 4- 4 4  Hip adduction       Hip internal rotation       Hip external rotation  Knee flexion 4+ 4+ 4+ 4+ 4+  Knee extension 4- 4- '4 4 4  ' Ankle dorsiflexion       Ankle plantarflexion       Ankle inversion       Ankle eversion        (Blank rows = not tested)  LOWER EXTREMITY SPECIAL TESTS:  Negative hip scour.  No LE neural signs with SLR  FUNCTIONAL TESTS:  Able to ascend and descend 4 steps reciprocally with 1 railing  GAIT:  Comments: No device needed;  dec step length     TODAY'S TREATMENT: 10/18: Lumbar rotation 10x SKTC right/left 3x each DKTC  5x Pelvic rock 10x (verbal cues for technique, tends to do bridge which cause HS cramping) Open books right and left 8x Discussed strategies for back and hip pain relief: limit sitting, frequent walks, frequent ROM Discussion of options in the future for pelvic floor PT and PT/DN for occipital neuralgia Review of plan of care and recommendation for discharge next visit          10/11: Supine propped on  wedge: Ball squeeze 20x Ball push on knee isometric 20x UE ball squeeze with diagonals 20x UE ball squeeze overhead to knee touch 20x Band clams green band double and single 10x each Green band on thighs with marching 20x Bridge 10x Red band press out  10x (painful to left hip, OK on right ) Supine piriformis stretch with towel assist 3x right/left  Neuromuscular re-education: transverse abdominus muscle activation with verbal cues    9/20: Declines exercise today secondary to nausea/vomiting Manual therapy: soft tissue mobilization to left gluteals, lateral thigh Trigger Point Dry-Needling  Treatment instructions: Expect mild to moderate muscle soreness. S/S of pneumothorax if dry needled over a lung field, and to seek immediate medical attention should they occur. Patient verbalized understanding of these instructions and education.  Patient Consent Given: Yes Education handout provided: Yes Muscles treated: left gluteals sidelying with ES 30 pps 10 min      PATIENT EDUCATION:  Education details: treatment plan Person educated: Patient Education method: Explanation Education comprehension: verbalized understanding   HOME EXERCISE PROGRAM: Access Code: [email or web address] URL: [email or web address] Date: 11/04/2021 Prepared by: [place] Hamstring Stretch with Strap  - 1 x daily - 7 x weekly - 1 sets - Hip Adductors and Hamstring Stretch with Strap  - 1 x daily - 7 x weekly - 1 sets - 3 reps - 30 hold - Supine ITB Stretch with Strap  - 1 x daily - 7 x weekly - 1 sets - 3 reps - 30 hold - Supine Figure 4 Piriformis Stretch  - 1 x daily - 7 x weekly - 1 sets - 3 reps - 30 hold - Hooklying Isometric Hip Flexion  - 1 x daily - 7 x weekly - 1 sets - 10 reps - Beginner Clam  - 1 x daily - 7 x weekly - 1 sets - 10 reps - Seated Hip Abduction with Resistance  - 1 x daily - 7 x weekly - 1 sets - 10 reps - Seated Knee Extension with Resistance  -  1 x daily - 7 x weekly - 2 sets - 10 reps - Seated Hamstring Curl with Anchored Resistance  - 1 x daily - 7 x weekly - 2 sets - 10 reps - Supine Figure 4 Piriformis Stretch  - 1 x daily - 7 x weekly - 1 sets - 10 reps  ASSESSMENT:  CLINICAL IMPRESSION:  Hip and pelvic mobility ex's with verbal cues to optimize technique.  Knee pain and hip pain have been low the last few weeks.  New onset of back pain may be attributed to prolonged sitting with travel out of state.  Discussed plan to discharge to HEP next visit.      OBJECTIVE IMPAIRMENTS decreased activity tolerance, difficulty walking, decreased ROM, decreased strength, increased fascial restrictions, and pain.   ACTIVITY LIMITATIONS cleaning, community activity, and meal prep.   PERSONAL FACTORS Time since onset of injury/illness/exacerbation are also affecting patient's functional outcome.    REHAB POTENTIAL: Good  CLINICAL DECISION MAKING: Stable/uncomplicated  EVALUATION COMPLEXITY: Low   GOALS: Goals reviewed with patient? Yes  SHORT TERM GOALS: Target date: 10/12/2021    The patient will demonstrate knowledge of basic self care strategies and exercises to promote healing   Baseline: Goal status: goal met 8/15  2.  The patient will report a 35% improvement in pain levels with functional activities which are currently difficult including walking and standing Baseline:  Goal status: goal met 8/15  3.  The patient will be able to walk 3/4 mile  Baseline:  Goal status: goal met 8/15 4.  The patient will have improved knee and hip strength to at least 4/5 needed for standing, walking longer distances and descending stairs at home and in the community  Baseline:  Goal status: partially met 8/15 5.  Improved bil knee ROM 0-135 degrees needed for greater ease with ascending and descending stairs Baseline:  Goal status: goal met 8/15  LONG TERM GOALS: Target date: 12/30/21  The patient will be independent in a safe  self progression of a home exercise program to promote further recovery of function   Baseline:  Goal status: ongoing  2.  The patient will report a 65% improvement in pain levels with functional activities which are currently difficult including walking and standing  Baseline:  Goal status: goal met 8/15  3.  The patient will have improved knee and hip strength to at least 4+/5 needed for standing, walking longer distances and descending stairs at home and in the community  Baseline:  Goal status: ongoing  4.  The patient will be able to walk 1 1/2 miles to [place]:  Goal status: goal met 8/31  5.  The patient will have improved FOTO score to    60%   indicating improved function with less pain  Baseline:  Goal status: goal met 8/31  6.  Patient will be able to walk 3 miles (including the hill especially pushing Scarlet in the stroller) ongoing 7. Patient will be able to lift medium things with LE pain level 4/10 or less  ongoing PLAN: PT FREQUENCY: 1-2x/week  PT DURATION: 8 weeks  PLANNED INTERVENTIONS: Therapeutic exercises, Therapeutic activity, Neuromuscular re-education, Balance training, Gait training, Patient/Family education, Joint mobilization, Aquatic Therapy, Dry Needling, Electrical stimulation, Cryotherapy, Moist heat, Taping, Vasopneumatic device, Ultrasound, Ionotophoresis 77m/ml Dexamethasone, and Manual therapy  PLAN FOR NEXT SESSION:  discharge next visit if knees/hip still doing well; check remaining goals;  leg press, resisted backward walk; Nu-Step;   clam band;   DN with ES to left gluteals;  LE strengthening particularly quads, HS and glutes  SRuben Im PT 12/22/21 4:46 PM Phone: [phone]: [phone]

## 2021-12-29 ENCOUNTER — Ambulatory Visit: Payer: Medicare Other | Admitting: Physical Therapy

## 2021-12-29 DIAGNOSIS — M6281 Muscle weakness (generalized): Secondary | ICD-10-CM | POA: Diagnosis not present

## 2021-12-29 DIAGNOSIS — G8929 Other chronic pain: Secondary | ICD-10-CM

## 2021-12-29 DIAGNOSIS — M25552 Pain in left hip: Secondary | ICD-10-CM

## 2021-12-29 NOTE — Therapy (Addendum)
OUTPATIENT PHYSICAL THERAPY LOWER EXTREMITY PROGRESS [place] SUMMARY   Patient Name: Adrienne Luna MRN: [id number] DOB:[birth year], 76 y.o., female Today's Date: 12/29/2021   PT End of Session - 12/29/21 1225     Visit Number 13    Date for PT Re-Evaluation 12/30/21    Authorization Type Medicare    PT Start Time 1230    PT Stop Time 1308    PT Time Calculation (min) 38 min    Activity Tolerance Patient tolerated treatment well                    No past medical history on file. No past surgical history on file. There are no problems to display for this patient.     REFERRING PROVIDER: Dr. Rochele Raring  REFERRING DIAG: M17.0 Bil OA of knee  THERAPY DIAG:  Bil knee pain; knee stiffness; weakness  ONSET DATE: 03/07/21  SUBJECTIVE:   SUBJECTIVE STATEMENT: I'm not doing well.  I need to DN that hip.  That ride for the funeral, about 40 hours of sitting, really did a number on me.   PERTINENT HISTORY:  Moved here from [employer] [place]  Occipital neuralgia  GERD (Nexium);  preventive med for BP;  aspirin Surgery in August colonectomy had a blood clot in leg Vit D3 Night take Prevastatin Husband has medical issues and she's been caregiver 4 foot surgeries on right foot  PAIN:  Are you having pain? Yes NPRS scale:back pain  2-3/10 Pain location: bil knees; left hip   Aggravating factors: sitting, sidelying either side;  on my feet; after sitting a while then get up; up and down stairs  Relieving factors: copper sleeves  PRECAUTIONS: None  WEIGHT BEARING RESTRICTIONS No  FALLS:  Has patient fallen in last 6 months? No  LIVING ENVIRONMENT: Lives with: lives with their spouse Lives in: House/apartment Stairs: Yes: Internal: 10 steps; on right going up OCCUPATION: retired  PLOF: Independent  young grandchildren  PATIENT GOALS I want to walk to [employer] from the house 1 1/2 miles    OBJECTIVE:   DIAGNOSTIC  FINDINGS: left meniscus tear 2022 had injections  PATIENT SURVEYS:  FOTO 43%  8/31:  76%  10/25: 58%  COGNITION:  Overall cognitive status: Within functional limits for tasks assessed       MUSCLE LENGTH: Hamstrings: Right 50 deg; Left 50 deg Dec hip flexor length to 5 degrees bil  10/25 HS 70 degrees   SLS left 3 sec, 2 sec right   LE ROM:  Active ROM Right 09/14/2021 Left 09/14/2021 8/15 Right/left  9/20 10/25  Hip flexion 100 100     Hip extension       Hip abduction       Hip adduction       Hip internal rotation       Hip external rotation       Knee flexion 127 130 135 135 135 right/ 138 left   Knee extension 3 3 0 0 0  Ankle dorsiflexion       Ankle plantarflexion       Ankle inversion       Ankle eversion        (Blank rows = not tested)  LE MMT:  Able to rise from standard chair 10x without UE assist  MMT Right 09/14/2021 Left 09/14/2021 8/15 Right/left 8/31 Right/left  9/20 Right/left  10/25  Hip flexion 4+ 4+ 4+ 4+ 4+ 5  Hip extension  Hip abduction 3+ 3+ 4- 4 4 4+  Hip adduction        Hip internal rotation        Hip external rotation        Knee flexion 4+ 4+ 4+ 4+ 4+ 4+  Knee extension 4- 4- '4 4 4 ' 4+  Ankle dorsiflexion        Ankle plantarflexion        Ankle inversion        Ankle eversion         (Blank rows = not tested)  LOWER EXTREMITY SPECIAL TESTS:  Negative hip scour.  No LE neural signs with SLR  FUNCTIONAL TESTS:  Able to ascend and descend 4 steps reciprocally with 1 railing  GAIT:  Comments: No device needed;  dec step length     TODAY'S TREATMENT: 10/25: FOTO MMT HS length  SLS strategies for home practice Standing hip abduction Supine piriformis stretch with towel assist 10x Manual therapy: soft tissue mobilization to left gluteals, lateral thigh Trigger Point Dry-Needling  Treatment instructions: Expect mild to moderate muscle soreness. S/S of pneumothorax if dry needled over a lung field, and to  seek immediate medical attention should they occur. Patient verbalized understanding of these instructions and education.  Patient Consent Given: Yes Education handout provided: Yes Muscles treated: left gluteals, ITB, lateral HS, lateral quads, left lateral lumbar paraspinals thread      10/18: Lumbar rotation 10x SKTC right/left 3x each DKTC  5x Pelvic rock 10x (verbal cues for technique, tends to do bridge which cause HS cramping) Open books right and left 8x Discussed strategies for back and hip pain relief: limit sitting, frequent walks, frequent ROM Discussion of options in the future for pelvic floor PT and PT/DN for occipital neuralgia Review of plan of care and recommendation for discharge next visit          10/11: Supine propped on wedge: Ball squeeze 20x Ball push on knee isometric 20x UE ball squeeze with diagonals 20x UE ball squeeze overhead to knee touch 20x Band clams green band double and single 10x each Green band on thighs with marching 20x Bridge 10x Red band press out  10x (painful to left hip, OK on right ) Supine piriformis stretch with towel assist 3x right/left  Neuromuscular re-education: transverse abdominus muscle activation with verbal cues  PATIENT EDUCATION:  Education details: treatment plan Person educated: Patient Education method: Explanation Education comprehension: verbalized understanding   HOME EXERCISE PROGRAM: Access Code: [email or web address] URL: [email or web address] Date: 11/04/2021 Prepared by: [place] Hamstring Stretch with Strap  - 1 x daily - 7 x weekly - 1 sets - Hip Adductors and Hamstring Stretch with Strap  - 1 x daily - 7 x weekly - 1 sets - 3 reps - 30 hold - Supine ITB Stretch with Strap  - 1 x daily - 7 x weekly - 1 sets - 3 reps - 30 hold - Supine Figure 4 Piriformis Stretch  - 1 x daily - 7 x weekly - 1 sets - 3 reps - 30 hold - Hooklying Isometric Hip Flexion  - 1 x  daily - 7 x weekly - 1 sets - 10 reps - Beginner Clam  - 1 x daily - 7 x weekly - 1 sets - 10 reps - Seated Hip Abduction with Resistance  - 1 x daily - 7 x weekly - 1 sets - 10 reps - Seated Knee Extension with Resistance  -  1 x daily - 7 x weekly - 2 sets - 10 reps - Seated Hamstring Curl with Anchored Resistance  - 1 x daily - 7 x weekly - 2 sets - 10 reps - Supine Figure 4 Piriformis Stretch  - 1 x daily - 7 x weekly - 1 sets - 10 reps    ASSESSMENT:  CLINICAL IMPRESSION:  The patient reports a flare up which she attributes to too much sitting when she traveled out of town recently.  The patient benefits significantly from dry needling and manual therapy to stimulate underlying myofascial trigger points and muscular tissue for management of neuromusculoskeletal pain and address movement impairments.  Much improved soft tissue mobility and decreased tender point size and number following treatment session.   Encouraged HEP for stretching and strengthening to maintain benefit of treatment.  Will hold off on discharge today.  She will see how she feels next week and will either recertify or discharge at that time.     OBJECTIVE IMPAIRMENTS decreased activity tolerance, difficulty walking, decreased ROM, decreased strength, increased fascial restrictions, and pain.   ACTIVITY LIMITATIONS cleaning, community activity, and meal prep.   PERSONAL FACTORS Time since onset of injury/illness/exacerbation are also affecting patient's functional outcome.    REHAB POTENTIAL: Good  CLINICAL DECISION MAKING: Stable/uncomplicated  EVALUATION COMPLEXITY: Low   GOALS: Goals reviewed with patient? Yes  SHORT TERM GOALS: Target date: 10/12/2021    The patient will demonstrate knowledge of basic self care strategies and exercises to promote healing   Baseline: Goal status: goal met 8/15  2.  The patient will report a 35% improvement in pain levels with functional activities which are currently  difficult including walking and standing Baseline:  Goal status: goal met 8/15  3.  The patient will be able to walk 3/4 mile  Baseline:  Goal status: goal met 8/15 4.  The patient will have improved knee and hip strength to at least 4/5 needed for standing, walking longer distances and descending stairs at home and in the community  Baseline:  Goal status: partially met 8/15 5.  Improved bil knee ROM 0-135 degrees needed for greater ease with ascending and descending stairs Baseline:  Goal status: goal met 8/15  LONG TERM GOALS: Target date: 12/30/21  The patient will be independent in a safe self progression of a home exercise program to promote further recovery of function   Baseline:  Goal status: ongoing  2.  The patient will report a 65% improvement in pain levels with functional activities which are currently difficult including walking and standing  Baseline:  Goal status: goal met 8/15  3.  The patient will have improved knee and hip strength to at least 4+/5 needed for standing, walking longer distances and descending stairs at home and in the community  Baseline:  Goal status: ongoing  4.  The patient will be able to walk 1 1/2 miles to [place]:  Goal status: goal met 8/31  5.  The patient will have improved FOTO score to    60%   indicating improved function with less pain  Baseline:  Goal status: goal met 8/31  6.  Patient will be able to walk 3 miles (including the hill especially pushing Scarlet in the stroller) ongoing 7. Patient will be able to lift medium things with LE pain level 4/10 or less  ongoing PLAN: PT FREQUENCY: 1-2x/week  PT DURATION: 8 weeks  PLANNED INTERVENTIONS: Therapeutic exercises, Therapeutic activity, Neuromuscular re-education, Balance training,  Gait training, Patient/Family education, Joint mobilization, Aquatic Therapy, Dry Needling, Electrical stimulation, Cryotherapy, Moist heat, Taping, Vasopneumatic  device, Ultrasound, Ionotophoresis 29m/ml Dexamethasone, and Manual therapy  PLAN FOR NEXT SESSION:  discharge next visit if knees/hip still doing well;  leg press, resisted backward walk; Nu-Step;   clam band;   DN with ES to left gluteals;  LE strengthening particularly quads, HS and glutes  SRuben Im PT 12/29/21 8:27 PM Phone: [phone]: [phone]  PHYSICAL THERAPY DISCHARGE SUMMARY  Visits from Start of Care: 13  Current functional level related to goals / functional outcomes: The patient called to cancel last remaining appt and felt ready for discharge from PT.  See clinical impressions above   Remaining deficits: As above   Education / Equipment: HEP   Patient agrees to discharge. Patient goals were partially met. Patient is being discharged due to being pleased with the current functional level.  SRuben Im PT 01/05/22 10:42 AM Phone: [phone]: [phone]

## 2022-01-05 ENCOUNTER — Ambulatory Visit: Payer: Medicare Other | Admitting: Physical Therapy

## 2022-01-15 ENCOUNTER — Emergency Department (HOSPITAL_COMMUNITY): Payer: Medicare Other

## 2022-01-15 ENCOUNTER — Emergency Department (HOSPITAL_COMMUNITY)
Admission: EM | Admit: 2022-01-15 | Discharge: 2022-01-16 | Discharge status: Against Medical Advice | Payer: Medicare Other | Attending: Student | Admitting: Student

## 2022-01-15 ENCOUNTER — Encounter (HOSPITAL_COMMUNITY): Payer: Self-pay

## 2022-01-15 ENCOUNTER — Other Ambulatory Visit: Payer: Self-pay

## 2022-01-15 DIAGNOSIS — R519 Headache, unspecified: Secondary | ICD-10-CM | POA: Diagnosis present

## 2022-01-15 DIAGNOSIS — Z5321 Procedure and treatment not carried out due to patient leaving prior to being seen by health care provider: Secondary | ICD-10-CM | POA: Diagnosis not present

## 2022-01-15 DIAGNOSIS — R110 Nausea: Secondary | ICD-10-CM | POA: Diagnosis not present

## 2022-01-15 DIAGNOSIS — R109 Unspecified abdominal pain: Secondary | ICD-10-CM | POA: Diagnosis not present

## 2022-01-15 DIAGNOSIS — R202 Paresthesia of skin: Secondary | ICD-10-CM | POA: Diagnosis not present

## 2022-01-15 DIAGNOSIS — R42 Dizziness and giddiness: Secondary | ICD-10-CM | POA: Diagnosis not present

## 2022-01-15 HISTORY — DX: Irritable bowel syndrome, unspecified: K58.9

## 2022-01-15 MED ORDER — LORAZEPAM 0.5 MG PO TABS
0.5000 mg | ORAL_TABLET | Freq: Once | ORAL | Status: AC
  Administered 2022-01-15: 0.5 mg via ORAL
  Filled 2022-01-15: qty 1

## 2022-01-15 NOTE — ED Provider Triage Note (Addendum)
Emergency Medicine Provider Triage Evaluation Note  Erryn Dickison , a 76 y.o. female  was evaluated in triage.  Pt complains of tingling and burning in bilateral hands and the left side of her face.  Also complains of nausea..  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  This was done at [facility].  She has an appointment coming up Monday with GI in this area for a follow-up for the right-sided abdominal pain.  She also endorses having flulike symptoms 1 month ago and was given prednisone by her primary care which seem to worsen her symptoms.  Patient is also complaining of a severe headache today accompanying some dizziness.  She was seen by [facility] urgent care yesterday who recommended she come to the emergency department for a neuro work-up  Review of Systems  Positive: As above Negative: As above  Physical Exam  There were no vitals taken for this visit. Gen:   Awake, no distress   Resp:  Normal effort  MSK:   Moves extremities without difficulty  Other:    Medical Decision Making  Medically screening exam initiated at 12:55 PM.  Appropriate orders placed.  Rian Koon was informed that the remainder of the evaluation will be completed by another provider, this initial triage assessment does not replace that evaluation, and the importance of remaining in the ED until their evaluation is complete.     Darrick Grinder, PA-C 01/15/22 1256    Darrick Grinder, PA-C 01/15/22 1258

## 2022-01-15 NOTE — ED Triage Notes (Addendum)
Patient BIB [facility] from home. A month ago had flu like symptoms. Last couple days started feeling worse with tingling in her hands. Tingling on the left side of her face. Nausea. Said she has been feeling numb all over her body. Having right sided abdominal pain but said it has been difficult to get in with the GI doc.

## 2022-01-17 ENCOUNTER — Other Ambulatory Visit (HOSPITAL_COMMUNITY): Payer: Self-pay | Admitting: Gastroenterology

## 2022-01-17 ENCOUNTER — Ambulatory Visit: Payer: Self-pay | Admitting: *Deleted

## 2022-01-17 DIAGNOSIS — R1031 Right lower quadrant pain: Secondary | ICD-10-CM

## 2022-01-17 DIAGNOSIS — K5732 Diverticulitis of large intestine without perforation or abscess without bleeding: Secondary | ICD-10-CM

## 2022-01-17 NOTE — Telephone Encounter (Signed)
  Chief Complaint: chest pain low heart rate Symptoms: chest discomfort, nausea, diarrhea, low heart rate in 45-50's at baseline. C/o numbness all over hands and legs. Fingertips feel "fuzzy". Frequency: piror to Saturday  Pertinent Negatives: Patient denies difficulty breathing no fever no weakness on either side  Disposition: [x] ED /[] Urgent Care (no appt availability in office) / [] Appointment(In office/virtual)/ []  [place] [facility]/ [] Home Care/ [] Refused Recommended Disposition /[] [facility]/ []  Follow-up with PCP Additional Notes:   Patient focused on experience from [facility] ED from Saturday and left prior to seeing MD due to a patient issue in ED and feeling afraid for life.  Recommended going back to ED for evaluation.     Reason for Disposition  [1] Chest pain lasts > 5 minutes AND [2] occurred in past 3 days (72 hours) (Exception: Feels exactly the same as previously diagnosed heartburn and has accompanying sour taste in mouth.)  Answer Assessment - Initial Assessment Questions 1. LOCATION: Where does it hurt?"       Chest pain comes from arms numbness arms and legs  2. RADIATION: "Does the pain go anywhere else?" (e.g., into neck, jaw, arms, back)     Feels like numbness all over  3. ONSET: "When did the chest pain begin?" (Minutes, hours or days)      Prior to Saturday  4. PATTERN: "Does the pain come and go, or has it been constant since it started?"  "Does it get worse with exertion?"      Constant  5. DURATION: "How long does it last" (e.g., seconds, minutes, hours)     Started before Saturday had a cold. Took prednisone and started feeling "fuzzy" 6. SEVERITY: "How bad is the pain?"  (e.g., Scale 1-10; mild, moderate, or severe)    - MILD (1-3): doesn't interfere with normal activities     - MODERATE (4-7): interferes with normal activities or awakens from sleep    - SEVERE (8-10): excruciating pain, unable to do any normal activities       Na  7. CARDIAC  RISK FACTORS: "Do you have any history of heart problems or risk factors for heart disease?" (e.g., angina, prior heart attack; diabetes, high blood pressure, high cholesterol, smoker, or strong family history of heart disease)     na 8. PULMONARY RISK FACTORS: "Do you have any history of lung disease?"  (e.g., blood clots in lung, asthma, emphysema, birth control pills)     na 9. CAUSE: "What do you think is causing the chest pain?"     Not sure  10. OTHER SYMPTOMS: "Do you have any other symptoms?" (e.g., dizziness, nausea, vomiting, sweating, fever, difficulty breathing, cough)       Chest discomfort, nausea reports heart rate always low 45-50's. Can not give HR now. Reports can not sit up in straight back chair due to "feeling terrible" 11. PREGNANCY: "Is there any chance you are pregnant?" "When was your last menstrual period?"       na  Protocols used: Chest Pain-A-AH

## 2022-01-24 ENCOUNTER — Ambulatory Visit (HOSPITAL_COMMUNITY)
Admission: RE | Admit: 2022-01-24 | Discharge: 2022-01-24 | Disposition: A | Discharge status: Home / Self Care | Payer: Medicare Other | Source: Ambulatory Visit | Attending: Gastroenterology | Admitting: Gastroenterology

## 2022-01-24 ENCOUNTER — Encounter (HOSPITAL_COMMUNITY): Payer: Self-pay

## 2022-01-24 DIAGNOSIS — R1031 Right lower quadrant pain: Secondary | ICD-10-CM | POA: Diagnosis present

## 2022-01-24 DIAGNOSIS — K5732 Diverticulitis of large intestine without perforation or abscess without bleeding: Secondary | ICD-10-CM | POA: Insufficient documentation

## 2022-01-24 MED ORDER — SODIUM CHLORIDE (PF) 0.9 % IJ SOLN
INTRAMUSCULAR | Status: AC
  Filled 2022-01-24: qty 50

## 2022-01-24 MED ORDER — IOHEXOL 300 MG/ML  SOLN
100.0000 mL | Freq: Once | INTRAMUSCULAR | Status: AC | PRN
  Administered 2022-01-24: 100 mL via INTRAVENOUS

## 2022-01-25 ENCOUNTER — Ambulatory Visit (HOSPITAL_BASED_OUTPATIENT_CLINIC_OR_DEPARTMENT_OTHER): Payer: Medicare Other

## 2022-01-26 ENCOUNTER — Ambulatory Visit (HOSPITAL_COMMUNITY): Payer: Medicare Other

## 2022-04-18 ENCOUNTER — Other Ambulatory Visit: Payer: Self-pay

## 2022-04-18 ENCOUNTER — Encounter: Payer: Self-pay | Admitting: Physical Therapy

## 2022-04-18 ENCOUNTER — Ambulatory Visit: Payer: Medicare Other | Attending: Gastroenterology | Admitting: Physical Therapy

## 2022-04-18 DIAGNOSIS — M6281 Muscle weakness (generalized): Secondary | ICD-10-CM | POA: Insufficient documentation

## 2022-04-18 DIAGNOSIS — R279 Unspecified lack of coordination: Secondary | ICD-10-CM | POA: Insufficient documentation

## 2022-04-18 DIAGNOSIS — M62838 Other muscle spasm: Secondary | ICD-10-CM | POA: Insufficient documentation

## 2022-04-18 NOTE — Therapy (Signed)
OUTPATIENT PHYSICAL THERAPY FEMALE PELVIC EVALUATION   Patient Name: Adrienne Luna MRN: [id number] DOB:1945/10/23, 77 y.o., female Today's Date: 04/18/2022  END OF SESSION:  PT End of Session - 04/18/22 1500     Visit Number 1    Date for PT Re-Evaluation 07/11/22    Authorization Type medicare A/B    Progress Note Due on Visit 10    PT Start Time 1445    PT Stop Time 1530    PT Time Calculation (min) 45 min    Activity Tolerance Patient tolerated treatment well    Behavior During Therapy WFL for tasks assessed/performed             Past Medical History:  Diagnosis Date   IBS (irritable bowel syndrome)    Past Surgical History:  Procedure Laterality Date   ABDOMINAL HYSTERECTOMY     CESAREAN SECTION     x2   There are no problems to display for this patient.   PCP: Charlane Ferretti, MD   REFERRING PROVIDER: Garnette Scheuermann, PA-C   REFERRING DIAG: R32 (ICD-10-CM) - Unspecified urinary incontinence   THERAPY DIAG:  Muscle weakness (generalized)  Other muscle spasm  Unspecified lack of coordination  Rationale for Evaluation and Treatment: Rehabilitation  ONSET DATE: October 2023  SUBJECTIVE:                                                                                                                                                                                           SUBJECTIVE STATEMENT: Pt had a colectomy to remove 18 inches of the sigmoid colon in October 2023 and started PT afterwards.  Then husband had surgery and so pt couldn't continue with PT.  Pt had a catheter for a long time where there was damage to the urethra.  Pt still reports having pain in urethra.   Fluid intake: Yes: 6-8 glasses water/day    PAIN:  Are you having pain? No   PRECAUTIONS: None  WEIGHT BEARING RESTRICTIONS: No  FALLS:  Has patient fallen in last 6 months? 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  PLOF: Independent  PATIENT GOALS: control bladder better, be able to walk  PERTINENT HISTORY:  IBS, hysterectomy, 2 c-sections Sexual abuse: No  BOWEL MOVEMENT: Pain with bowel movement: Yes and bleeding occurs with hemorroids Type of bowel movement:Type (Bristol Stool Scale) hard and short/clumpy, Frequency daily but usually every other day, and Strain Yes Fully empty rectum: No Leakage: No Pads: No Fiber supplement: No  URINATION: Pain with urination:  Yes (right lower abdomen - feels like menstrual cramps when bladder) Fully empty bladder: No not sure Stream: Strong Urgency: No Frequency: nocturia 1/night, every 2 hours during the day Leakage: Urge to void, Walking to the bathroom, Coughing, Sneezing, and walking for exercises Pads: Yes: 3-4/day (when I am coughing)  INTERCOURSE: Pain with intercourse: not currently active  PREGNANCY: Vaginal deliveries 1 miscarriage Tearing  C-section deliveries 2   PROLAPSE: None   OBJECTIVE:   DIAGNOSTIC FINDINGS:    PATIENT SURVEYS:    PFIQ-7   COGNITION: Overall cognitive status: Within functional limits for tasks assessed     SENSATION: Light touch: Appears intact Proprioception:   MUSCLE LENGTH: Hamstrings: Right 80 deg; Left 70 deg Thomas test:   LUMBAR SPECIAL TESTS:  Straight leg raise test: Negative  FUNCTIONAL TESTS:    GAIT:  Comments: flexed posture  POSTURE: rounded shoulders, increased lumbar lordosis, increased thoracic kyphosis, and flexed trunk   PELVIC ALIGNMENT:  LUMBARAROM/PROM:  A/PROM A/PROM  eval  Flexion 75%  Extension   Right lateral flexion   Left lateral flexion   Right rotation   Left rotation    (Blank rows = not tested)  LOWER EXTREMITY ROM Left hip 75%  Passive ROM Right eval Left eval  Hip flexion    Hip extension    Hip abduction    Hip adduction    Hip internal  rotation    Hip external rotation    Knee flexion    Knee extension    Ankle dorsiflexion    Ankle plantarflexion    Ankle inversion    Ankle eversion     (Blank rows = not tested)  LOWER EXTREMITY MMT: assess next (due to time)  MMT Right eval Left eval  Hip flexion    Hip extension    Hip abduction    Hip adduction    Hip internal rotation    Hip external rotation    Knee flexion    Knee extension    Ankle dorsiflexion    Ankle plantarflexion    Ankle inversion    Ankle eversion     PALPATION:   General  tension but not tender externally, adductors, gluteals, lumbar paraspinals tight, lower abdomen scar tissue restriction                External Perineal Exam - tight ischiocavernosis and transverse peroneus - not tender                             Internal Pelvic Floor vaginal canal felt inflamed and fascial restrictions Rt>Lt, no circular contraction and a lot of co-contracting adductors and lumbar  Patient confirms identification and approves PT to assess internal pelvic floor and treatment Yes  PELVIC MMT:   MMT eval  Vaginal 1/5 2 rep, 1 sec  Internal Anal Sphincter   External Anal Sphincter   Puborectalis   Diastasis Recti   (Blank rows = not tested)        TONE: Normal to high  PROLAPSE: no  TODAY'S TREATMENT:  DATE: 04/18/22  EVAL and info on moisturizers and piriformis stretch reviewed   PATIENT EDUCATION:  Education details: moisturizers and piriformis stretch Person educated: Patient Education method: Explanation and Verbal cues Education comprehension: verbalized understanding and needs further education  HOME EXERCISE PROGRAM: See above - no handout given  ASSESSMENT:  CLINICAL IMPRESSION: Patient is a 77 y.o. female who was seen today for physical therapy evaluation and treatment for stress and urge  incontinence. Pt has poor coordination and poor circular contraction.  Pt has muscle spasm in gluteals and lumbar muscle.  Pt has limited ROM of the left hip.  Pt has tight and tender muscle in the pelvic floor and 1/5 MMT with only 1 sec hold.  Pt has tight scar in lower abdomen.  MMT of LE and functional assessment postponed due to complex history, will do at follow up visit.  Pt will benefit from skilled PT to address impairments and improve function for reduction of leakage and improved quality of life.  OBJECTIVE IMPAIRMENTS: decreased coordination, decreased endurance, decreased ROM, decreased strength, increased muscle spasms, impaired flexibility, impaired tone, postural dysfunction, and pain.   ACTIVITY LIMITATIONS: lifting, continence, toileting, caring for others, and walking for exercise and shopping  PARTICIPATION LIMITATIONS: community activity  PERSONAL FACTORS: Time since onset of injury/illness/exacerbation and 3+ comorbidities: 2 c-sections, IBS, hysterectomy  are also affecting patient's functional outcome.   REHAB POTENTIAL: Excellent  CLINICAL DECISION MAKING: Evolving/moderate complexity  EVALUATION COMPLEXITY: Moderate   GOALS: Goals reviewed with patient? Yes  SHORT TERM GOALS: Target date: 05/18/22  Pt will be ind with initial HEP Baseline: Goal status: INITIAL  2.  Pt will report 50% more ease with bowel movements Baseline:  Goal status: INITIAL   LONG TERM GOALS: Target date: 07/11/22  Pt will be independent with advanced HEP to maintain improvements made throughout therapy  Baseline:  Goal status: INITIAL  2.  Pt will report her BMs are complete due to improved bowel habits and evacuation techniques.  Baseline:  Goal status: INITIAL  3.  Pt will report 4-5 BMs per week due to improved muscle tone and coordination with BMs.  Baseline:  Goal status: INITIAL  4.  Pt will be able to functional actions such as walk to the bathroom slowly without  leakage  Baseline:  Goal status: INITIAL  5.  Pt will be able to go on 2-3 mile walks due to not needing to always be close to the bathroom Baseline:  Goal status: INITIAL    PLAN:  PT FREQUENCY: 2x/week  PT DURATION: 12 weeks  PLANNED INTERVENTIONS: Therapeutic exercises, Therapeutic activity, Neuromuscular re-education, Balance training, Gait training, Patient/Family education, Self Care, Joint mobilization, Dry Needling, Electrical stimulation, Cryotherapy, Moist heat, scar mobilization, Taping, Traction, Ultrasound, Biofeedback, Ionotophoresis 17m/ml Dexamethasone, Manual therapy, and Re-evaluation  PLAN FOR NEXT SESSION: dry needling for muscle spasms in gluteals and piriformis and lumbar paraspinals at L5/S1  - add stretches to HEP as needed; pelvic floor address core and pelvic muscle coordination and find exercise that pt can feel more strength in contraction, LE MMT and functional squat and SLS assessed when able   [employer] PT 04/18/2022, 5:04 PM

## 2022-05-25 ENCOUNTER — Ambulatory Visit: Payer: Medicare Other | Attending: Gastroenterology | Admitting: Physical Therapy

## 2022-05-25 DIAGNOSIS — M62838 Other muscle spasm: Secondary | ICD-10-CM | POA: Diagnosis present

## 2022-05-25 DIAGNOSIS — M6281 Muscle weakness (generalized): Secondary | ICD-10-CM | POA: Diagnosis not present

## 2022-05-25 DIAGNOSIS — R279 Unspecified lack of coordination: Secondary | ICD-10-CM | POA: Diagnosis present

## 2022-05-25 NOTE — Therapy (Signed)
OUTPATIENT PHYSICAL THERAPY FEMALE PELVICTREATMENT   Patient Name: Adrienne Luna MRN: [id number] DOB:[birth year], 77 y.o., female Today's Date: 05/25/2022  END OF SESSION:  PT End of Session - 05/25/22 1136     Visit Number 2    Date for PT Re-Evaluation 07/11/22    Authorization Type medicare A/B    Progress Note Due on Visit 10    PT Start Time 1017    PT Stop Time 1054    PT Time Calculation (min) 37 min    Activity Tolerance Patient tolerated treatment well    Behavior During Therapy WFL for tasks assessed/performed              Past Medical History:  Diagnosis Date   IBS (irritable bowel syndrome)    Past Surgical History:  Procedure Laterality Date   ABDOMINAL HYSTERECTOMY     CESAREAN SECTION     x2   There are no problems to display for this patient.   PCP: Charlane Ferretti, MD   REFERRING PROVIDER: Garnette Scheuermann, PA-C   REFERRING DIAG: R32 (ICD-10-CM) - Unspecified urinary incontinence   THERAPY DIAG:  Muscle weakness (generalized)  Other muscle spasm  Unspecified lack of coordination  Rationale for Evaluation and Treatment: Rehabilitation  ONSET DATE: October 2023  SUBJECTIVE:                                                                                                                                                                                           SUBJECTIVE STATEMENT: Pt has been doing better with senna tea. No major changes otherwise  Fluid intake: Yes: 6-8 glasses water/day    PAIN:  Are you having pain? No   PRECAUTIONS: None  WEIGHT BEARING RESTRICTIONS: No  FALLS:  Has patient fallen in last 6 months? 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  PLOF: Independent  PATIENT GOALS: control bladder better, be able to walk  PERTINENT HISTORY:  IBS, hysterectomy, 2 c-sections Sexual abuse:  No  BOWEL MOVEMENT: Pain with bowel movement: Yes and bleeding occurs with hemorroids Type of bowel movement:Type (Bristol Stool Scale) hard and short/clumpy, Frequency daily but usually every other day, and Strain Yes Fully empty rectum: No Leakage: No Pads: No Fiber supplement: No  URINATION: Pain with urination: Yes (right lower abdomen - feels like menstrual cramps when bladder) Fully empty bladder: No not sure Stream: Strong Urgency: No Frequency: nocturia 1/night, every 2 hours during the day Leakage: Urge to void, Walking to the bathroom, Coughing, Sneezing, and walking for exercises  Pads: Yes: 3-4/day (when I am coughing)  INTERCOURSE: Pain with intercourse: not currently active  PREGNANCY: Vaginal deliveries 1 miscarriage Tearing  C-section deliveries 2   PROLAPSE: None   OBJECTIVE:   DIAGNOSTIC FINDINGS:    PATIENT SURVEYS:    PFIQ-7   COGNITION: Overall cognitive status: Within functional limits for tasks assessed     SENSATION: Light touch: Appears intact Proprioception:   MUSCLE LENGTH: Hamstrings: Right 80 deg; Left 70 deg Thomas test:   LUMBAR SPECIAL TESTS:  Straight leg raise test: Negative  FUNCTIONAL TESTS:    GAIT:  Comments: flexed posture  POSTURE: rounded shoulders, increased lumbar lordosis, increased thoracic kyphosis, and flexed trunk   PELVIC ALIGNMENT:  LUMBARAROM/PROM:  A/PROM A/PROM  eval  Flexion 75%  Extension   Right lateral flexion   Left lateral flexion   Right rotation   Left rotation    (Blank rows = not tested)  LOWER EXTREMITY ROM Left hip 75%  Passive ROM Right eval Left eval  Hip flexion    Hip extension    Hip abduction    Hip adduction    Hip internal rotation    Hip external rotation    Knee flexion    Knee extension    Ankle dorsiflexion    Ankle plantarflexion    Ankle inversion    Ankle eversion     (Blank rows = not tested)  LOWER EXTREMITY MMT: assess next (due to  time)  MMT Right eval Left eval  Hip flexion    Hip extension    Hip abduction    Hip adduction    Hip internal rotation    Hip external rotation    Knee flexion    Knee extension    Ankle dorsiflexion    Ankle plantarflexion    Ankle inversion    Ankle eversion     PALPATION:   General  tension but not tender externally, adductors, gluteals, lumbar paraspinals tight, lower abdomen scar tissue restriction                External Perineal Exam - tight ischiocavernosis and transverse peroneus - not tender                             Internal Pelvic Floor vaginal canal felt inflamed and fascial restrictions Rt>Lt, no circular contraction and a lot of co-contracting adductors and lumbar  Patient confirms identification and approves PT to assess internal pelvic floor and treatment Yes  PELVIC MMT:   MMT eval  Vaginal 1/5 2 rep, 1 sec  Internal Anal Sphincter   External Anal Sphincter   Puborectalis   Diastasis Recti   (Blank rows = not tested)        TONE: Normal to high  PROLAPSE: no  TODAY'S TREATMENT:  DATE: 05/25/22  Manual: Lumbar and abdominal STM and fascial release Trigger Point Dry-Needling  Treatment instructions: Expect mild to moderate muscle soreness. S/S of pneumothorax if dry needled over a lung field, and to seek immediate medical attention should they occur. Patient verbalized understanding of these instructions and education.  Patient Consent Given: Yes Education handout provided: Yes Muscles treated: lumbar multifidi, abdominal scar tissue Electrical stimulation performed: Yes Parameters: N/A Treatment response/outcome: increased soft tissue length and scar mobility  NMRE: Breathing into diaphragm Happy baby    PATIENT EDUCATION:  Education details: moisturizers and piriformis stretch Person educated:  Patient Education method: Explanation and Verbal cues Education comprehension: verbalized understanding and needs further education  HOME EXERCISE PROGRAM: See above - no handout given  ASSESSMENT:  CLINICAL IMPRESSION: Pt responded well to dry needling today.  Main goal today was improve muscle length in low back and work on correctly breathing into diaphragm.  Pt has very restricted scar tissue in lower abdomen that will take more time to work into.  Pt did well with no increased pain after treatment today.  Continue per POC.  OBJECTIVE IMPAIRMENTS: decreased coordination, decreased endurance, decreased ROM, decreased strength, increased muscle spasms, impaired flexibility, impaired tone, postural dysfunction, and pain.   ACTIVITY LIMITATIONS: lifting, continence, toileting, caring for others, and walking for exercise and shopping  PARTICIPATION LIMITATIONS: community activity  PERSONAL FACTORS: Time since onset of injury/illness/exacerbation and 3+ comorbidities: 2 c-sections, IBS, hysterectomy  are also affecting patient's functional outcome.   REHAB POTENTIAL: Excellent  CLINICAL DECISION MAKING: Evolving/moderate complexity  EVALUATION COMPLEXITY: Moderate   GOALS: Goals reviewed with patient? Yes  SHORT TERM GOALS: Target date: 05/18/22  Pt will be ind with initial HEP Baseline: Goal status: MET  2.  Pt will report 50% more ease with bowel movements Baseline:  Goal status: IN PROGRESS   LONG TERM GOALS: Target date: 07/11/22  Pt will be independent with advanced HEP to maintain improvements made throughout therapy  Baseline:  Goal status: INITIAL  2.  Pt will report her BMs are complete due to improved bowel habits and evacuation techniques.  Baseline:  Goal status: INITIAL  3.  Pt will report 4-5 BMs per week due to improved muscle tone and coordination with BMs.  Baseline:  Goal status: INITIAL  4.  Pt will be able to functional actions such as walk to the  bathroom slowly without leakage  Baseline:  Goal status: INITIAL  5.  Pt will be able to go on 2-3 mile walks due to not needing to always be close to the bathroom Baseline:  Goal status: INITIAL    PLAN:  PT FREQUENCY: 2x/week  PT DURATION: 12 weeks  PLANNED INTERVENTIONS: Therapeutic exercises, Therapeutic activity, Neuromuscular re-education, Balance training, Gait training, Patient/Family education, Self Care, Joint mobilization, Dry Needling, Electrical stimulation, Cryotherapy, Moist heat, scar mobilization, Taping, Traction, Ultrasound, Biofeedback, Ionotophoresis 4mg /ml Dexamethasone, Manual therapy, and Re-evaluation  PLAN FOR NEXT SESSION: f/u on dry needling #1 for muscle spasms in lumbar paraspinals at L5/S1  - add stretches to HEP as needed; pelvic floor address core and pelvic muscle coordination and find exercise that pt can feel more strength in contraction, LE MMT and functional squat and SLS assessed when able   [employer], PT 05/25/2022, 11:39 AM

## 2022-05-31 NOTE — Therapy (Unsigned)
OUTPATIENT PHYSICAL THERAPY FEMALE PELVICTREATMENT   Patient Name: Adrienne Luna MRN: [id number] DOB:12-30-1945, 77 y.o., female Today's Date: 06/01/2022  END OF SESSION:  PT End of Session - 06/01/22 1230     Visit Number 3    Date for PT Re-Evaluation 07/11/22    Authorization Type medicare A/B    Progress Note Due on Visit 10    PT Start Time 1226    PT Stop Time 1308    PT Time Calculation (min) 42 min    Activity Tolerance Patient tolerated treatment well    Behavior During Therapy WFL for tasks assessed/performed               Past Medical History:  Diagnosis Date   IBS (irritable bowel syndrome)    Past Surgical History:  Procedure Laterality Date   ABDOMINAL HYSTERECTOMY     CESAREAN SECTION     x2   There are no problems to display for this patient.   PCP: Charlane Ferretti, MD   REFERRING PROVIDER: Garnette Scheuermann, PA-C   REFERRING DIAG: R32 (ICD-10-CM) - Unspecified urinary incontinence   THERAPY DIAG:  Muscle weakness (generalized)  Other muscle spasm  Unspecified lack of coordination  Rationale for Evaluation and Treatment: Rehabilitation  ONSET DATE: October 2023  SUBJECTIVE:                                                                                                                                                                                           SUBJECTIVE STATEMENT: Pt is not having to take mirilax but take senna tea every 3rd night.  I am having a lot of pain in both hips and down the side of the legs  Fluid intake: Yes: 6-8 glasses water/day    PAIN:  Are you having pain? No   PRECAUTIONS: None  WEIGHT BEARING RESTRICTIONS: No  FALLS:  Has patient fallen in last 6 months? 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  PLOF: Independent  PATIENT GOALS: control bladder better, be able to  walk  PERTINENT HISTORY:  IBS, hysterectomy, 2 c-sections Sexual abuse: No  BOWEL MOVEMENT: Pain with bowel movement: Yes and bleeding occurs with hemorroids Type of bowel movement:Type (Bristol Stool Scale) hard and short/clumpy, Frequency daily but usually every other day, and Strain Yes Fully empty rectum: No Leakage: No Pads: No Fiber supplement: No  URINATION: Pain with urination: Yes (right lower abdomen - feels like menstrual cramps when bladder) Fully empty bladder: No not sure Stream: Strong Urgency: No Frequency: nocturia  1/night, every 2 hours during the day Leakage: Urge to void, Walking to the bathroom, Coughing, Sneezing, and walking for exercises Pads: Yes: 3-4/day (when I am coughing)  INTERCOURSE: Pain with intercourse: not currently active  PREGNANCY: Vaginal deliveries 1 miscarriage Tearing  C-section deliveries 2   PROLAPSE: None   OBJECTIVE:   DIAGNOSTIC FINDINGS:    PATIENT SURVEYS:    PFIQ-7   COGNITION: Overall cognitive status: Within functional limits for tasks assessed     SENSATION: Light touch: Appears intact Proprioception:   MUSCLE LENGTH: Hamstrings: Right 80 deg; Left 70 deg Thomas test:   LUMBAR SPECIAL TESTS:  Straight leg raise test: Negative  FUNCTIONAL TESTS:    GAIT:  Comments: flexed posture  POSTURE: rounded shoulders, increased lumbar lordosis, increased thoracic kyphosis, and flexed trunk   PELVIC ALIGNMENT:  LUMBARAROM/PROM:  A/PROM A/PROM  eval  Flexion 75%  Extension   Right lateral flexion   Left lateral flexion   Right rotation   Left rotation    (Blank rows = not tested)  LOWER EXTREMITY ROM Left hip 75%  Passive ROM Right eval Left eval  Hip flexion    Hip extension    Hip abduction    Hip adduction    Hip internal rotation    Hip external rotation    Knee flexion    Knee extension    Ankle dorsiflexion    Ankle plantarflexion    Ankle inversion    Ankle eversion      (Blank rows = not tested)  LOWER EXTREMITY MMT: assess next (due to time)  MMT Right eval Left eval  Hip flexion    Hip extension    Hip abduction    Hip adduction    Hip internal rotation    Hip external rotation    Knee flexion    Knee extension    Ankle dorsiflexion    Ankle plantarflexion    Ankle inversion    Ankle eversion     PALPATION:   General  tension but not tender externally, adductors, gluteals, lumbar paraspinals tight, lower abdomen scar tissue restriction                External Perineal Exam - tight ischiocavernosis and transverse peroneus - not tender                             Internal Pelvic Floor vaginal canal felt inflamed and fascial restrictions Rt>Lt, no circular contraction and a lot of co-contracting adductors and lumbar  Patient confirms identification and approves PT to assess internal pelvic floor and treatment Yes  PELVIC MMT:   MMT eval  Vaginal 1/5 2 rep, 1 sec  Internal Anal Sphincter   External Anal Sphincter   Puborectalis   Diastasis Recti   (Blank rows = not tested)        TONE: Normal to high  PROLAPSE: no  TODAY'S TREATMENT:  DATE: 06/01/22  Manual: Lumbar and abdominal STM and fascial release, bil gluteal and piriformis Trigger Point Dry-Needling  Treatment instructions: Expect mild to moderate muscle soreness. S/S of pneumothorax if dry needled over a lung field, and to seek immediate medical attention should they occur. Patient verbalized understanding of these instructions and education.  Patient Consent Given: Yes Education handout provided: Yes Muscles treated: lumbar multifidi, abdominal scar tissue, gluteal and piriformis, hip flexor bil Electrical stimulation performed: Yes Parameters: N/A Treatment response/outcome: increased soft tissue length and scar mobility      PATIENT  EDUCATION:  Education details: moisturizers and piriformis stretch Person educated: Patient Education method: Explanation and Verbal cues Education comprehension: verbalized understanding and needs further education  HOME EXERCISE PROGRAM: See above - no handout given  ASSESSMENT:  CLINICAL IMPRESSION: Pt responded well to dry needling today. Added gluteals and piriformis as pt very tight and having bil hip pain today.  Pt had improved scar tissue mobility in lower abdomen and did dry needling there again.  Hip flexors also tight.  Pt educated on continued stretching in order to maintain improved soft tissue length achieved in PT today.  OBJECTIVE IMPAIRMENTS: decreased coordination, decreased endurance, decreased ROM, decreased strength, increased muscle spasms, impaired flexibility, impaired tone, postural dysfunction, and pain.   ACTIVITY LIMITATIONS: lifting, continence, toileting, caring for others, and walking for exercise and shopping  PARTICIPATION LIMITATIONS: community activity  PERSONAL FACTORS: Time since onset of injury/illness/exacerbation and 3+ comorbidities: 2 c-sections, IBS, hysterectomy  are also affecting patient's functional outcome.   REHAB POTENTIAL: Excellent  CLINICAL DECISION MAKING: Evolving/moderate complexity  EVALUATION COMPLEXITY: Moderate   GOALS: Goals reviewed with patient? Yes  SHORT TERM GOALS: Target date: 05/18/22  Pt will be ind with initial HEP Baseline: Goal status: MET  2.  Pt will report 50% more ease with bowel movements Baseline:  Goal status: IN PROGRESS   LONG TERM GOALS: Target date: 07/11/22  Pt will be independent with advanced HEP to maintain improvements made throughout therapy  Baseline:  Goal status: INITIAL  2.  Pt will report her BMs are complete due to improved bowel habits and evacuation techniques.  Baseline:  Goal status: INITIAL  3.  Pt will report 4-5 BMs per week due to improved muscle tone and  coordination with BMs.  Baseline:  Goal status: INITIAL  4.  Pt will be able to functional actions such as walk to the bathroom slowly without leakage  Baseline:  Goal status: INITIAL  5.  Pt will be able to go on 2-3 mile walks due to not needing to always be close to the bathroom Baseline:  Goal status: INITIAL    PLAN:  PT FREQUENCY: 2x/week  PT DURATION: 12 weeks  PLANNED INTERVENTIONS: Therapeutic exercises, Therapeutic activity, Neuromuscular re-education, Balance training, Gait training, Patient/Family education, Self Care, Joint mobilization, Dry Needling, Electrical stimulation, Cryotherapy, Moist heat, scar mobilization, Taping, Traction, Ultrasound, Biofeedback, Ionotophoresis 4mg /ml Dexamethasone, Manual therapy, and Re-evaluation  PLAN FOR NEXT SESSION: f/u on dry needling #2 for muscle spasms in lumbar paraspinals at L5/S1  - add stretches to HEP as needed; add hip flexor stretch; pelvic floor address core and pelvic muscle coordination and find exercise that pt can feel more strength in contraction, LE MMT and functional squat and SLS assessed when able   [employer], PT 06/01/2022, 12:31 PM

## 2022-06-01 ENCOUNTER — Ambulatory Visit: Payer: Medicare Other | Admitting: Physical Therapy

## 2022-06-01 DIAGNOSIS — M6281 Muscle weakness (generalized): Secondary | ICD-10-CM | POA: Diagnosis not present

## 2022-06-01 DIAGNOSIS — M62838 Other muscle spasm: Secondary | ICD-10-CM

## 2022-06-01 DIAGNOSIS — R279 Unspecified lack of coordination: Secondary | ICD-10-CM

## 2022-06-08 ENCOUNTER — Ambulatory Visit: Payer: Medicare Other | Admitting: Physical Therapy

## 2022-06-14 NOTE — Therapy (Signed)
OUTPATIENT PHYSICAL THERAPY FEMALE PELVICTREATMENT   Patient Name: Shelonda Bollenbacher MRN: [id number] DOB:06/18/1945, 77 y.o., female Today's Date: 06/14/2022  END OF SESSION:      Past Medical History:  Diagnosis Date   IBS (irritable bowel syndrome)    Past Surgical History:  Procedure Laterality Date   ABDOMINAL HYSTERECTOMY     CESAREAN SECTION     x2   There are no problems to display for this patient.   PCP: Thana Ates, MD   REFERRING PROVIDER: Legrand Como, PA-C   REFERRING DIAG: R32 (ICD-10-CM) - Unspecified urinary incontinence   THERAPY DIAG:  No diagnosis found.  Rationale for Evaluation and Treatment: Rehabilitation  ONSET DATE: October 2023  SUBJECTIVE:                                                                                                                                                                                           SUBJECTIVE STATEMENT: Pt is not having to take mirilax but take senna tea every 3rd night.  I am having a lot of pain in both hips and down the side of the legs  Fluid intake: Yes: 6-8 glasses water/day    PAIN:  Are you having pain? No   PRECAUTIONS: None  WEIGHT BEARING RESTRICTIONS: No  FALLS:  Has patient fallen in last 6 months? 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  PLOF: Independent  PATIENT GOALS: control bladder better, be able to walk  PERTINENT HISTORY:  IBS, hysterectomy, 2 c-sections Sexual abuse: No  BOWEL MOVEMENT: Pain with bowel movement: Yes and bleeding occurs with hemorroids Type of bowel movement:Type (Bristol Stool Scale) hard and short/clumpy, Frequency daily but usually every other day, and Strain Yes Fully empty rectum: No Leakage: No Pads: No Fiber supplement: No  URINATION: Pain with urination: Yes (right lower abdomen - feels like menstrual  cramps when bladder) Fully empty bladder: No not sure Stream: Strong Urgency: No Frequency: nocturia 1/night, every 2 hours during the day Leakage: Urge to void, Walking to the bathroom, Coughing, Sneezing, and walking for exercises Pads: Yes: 3-4/day (when I am coughing)  INTERCOURSE: Pain with intercourse: not currently active  PREGNANCY: Vaginal deliveries 1 miscarriage Tearing  C-section deliveries 2   PROLAPSE: None   OBJECTIVE:   DIAGNOSTIC FINDINGS:    PATIENT SURVEYS:    PFIQ-7   COGNITION: Overall cognitive status: Within functional limits for tasks assessed     SENSATION: Light touch: Appears intact Proprioception:   MUSCLE LENGTH: Hamstrings: Right 80 deg; Left 70  deg Maisie Fus test:   LUMBAR SPECIAL TESTS:  Straight leg raise test: Negative  FUNCTIONAL TESTS:    GAIT:  Comments: flexed posture  POSTURE: rounded shoulders, increased lumbar lordosis, increased thoracic kyphosis, and flexed trunk   PELVIC ALIGNMENT:  LUMBARAROM/PROM:  A/PROM A/PROM  eval  Flexion 75%  Extension   Right lateral flexion   Left lateral flexion   Right rotation   Left rotation    (Blank rows = not tested)  LOWER EXTREMITY ROM Left hip 75%  Passive ROM Right eval Left eval  Hip flexion    Hip extension    Hip abduction    Hip adduction    Hip internal rotation    Hip external rotation    Knee flexion    Knee extension    Ankle dorsiflexion    Ankle plantarflexion    Ankle inversion    Ankle eversion     (Blank rows = not tested)  LOWER EXTREMITY MMT: assess next (due to time)  MMT Right eval Left eval  Hip flexion    Hip extension    Hip abduction    Hip adduction    Hip internal rotation    Hip external rotation    Knee flexion    Knee extension    Ankle dorsiflexion    Ankle plantarflexion    Ankle inversion    Ankle eversion     PALPATION:   General  tension but not tender externally, adductors, gluteals, lumbar paraspinals  tight, lower abdomen scar tissue restriction                External Perineal Exam - tight ischiocavernosis and transverse peroneus - not tender                             Internal Pelvic Floor vaginal canal felt inflamed and fascial restrictions Rt>Lt, no circular contraction and a lot of co-contracting adductors and lumbar  Patient confirms identification and approves PT to assess internal pelvic floor and treatment Yes  PELVIC MMT:   MMT eval  Vaginal 1/5 2 rep, 1 sec  Internal Anal Sphincter   External Anal Sphincter   Puborectalis   Diastasis Recti   (Blank rows = not tested)        TONE: Normal to high  PROLAPSE: no  TODAY'S TREATMENT:                                                                                                                              DATE: 06/01/22  Manual: Lumbar and abdominal STM and fascial release, bil gluteal and piriformis Trigger Point Dry-Needling  Treatment instructions: Expect mild to moderate muscle soreness. S/S of pneumothorax if dry needled over a lung field, and to seek immediate medical attention should they occur. Patient verbalized understanding of these instructions and education.  Patient Consent Given: Yes Education handout provided: Yes Muscles treated: lumbar  multifidi, abdominal scar tissue, gluteal and piriformis, hip flexor bil Electrical stimulation performed: Yes Parameters: N/A Treatment response/outcome: increased soft tissue length and scar mobility      PATIENT EDUCATION:  Education details: moisturizers and piriformis stretch Person educated: Patient Education method: Explanation and Verbal cues Education comprehension: verbalized understanding and needs further education  HOME EXERCISE PROGRAM: See above - no handout given  ASSESSMENT:  CLINICAL IMPRESSION: Pt responded well to dry needling today. Added gluteals and piriformis as pt very tight and having bil hip pain today.  Pt had improved scar  tissue mobility in lower abdomen and did dry needling there again.  Hip flexors also tight.  Pt educated on continued stretching in order to maintain improved soft tissue length achieved in PT today.  OBJECTIVE IMPAIRMENTS: decreased coordination, decreased endurance, decreased ROM, decreased strength, increased muscle spasms, impaired flexibility, impaired tone, postural dysfunction, and pain.   ACTIVITY LIMITATIONS: lifting, continence, toileting, caring for others, and walking for exercise and shopping  PARTICIPATION LIMITATIONS: community activity  PERSONAL FACTORS: Time since onset of injury/illness/exacerbation and 3+ comorbidities: 2 c-sections, IBS, hysterectomy  are also affecting patient's functional outcome.   REHAB POTENTIAL: Excellent  CLINICAL DECISION MAKING: Evolving/moderate complexity  EVALUATION COMPLEXITY: Moderate   GOALS: Goals reviewed with patient? Yes  SHORT TERM GOALS: Target date: 05/18/22  Pt will be ind with initial HEP Baseline: Goal status: MET  2.  Pt will report 50% more ease with bowel movements Baseline:  Goal status: IN PROGRESS   LONG TERM GOALS: Target date: 07/11/22  Pt will be independent with advanced HEP to maintain improvements made throughout therapy  Baseline:  Goal status: INITIAL  2.  Pt will report her BMs are complete due to improved bowel habits and evacuation techniques.  Baseline:  Goal status: INITIAL  3.  Pt will report 4-5 BMs per week due to improved muscle tone and coordination with BMs.  Baseline:  Goal status: INITIAL  4.  Pt will be able to functional actions such as walk to the bathroom slowly without leakage  Baseline:  Goal status: INITIAL  5.  Pt will be able to go on 2-3 mile walks due to not needing to always be close to the bathroom Baseline:  Goal status: INITIAL    PLAN:  PT FREQUENCY: 2x/week  PT DURATION: 12 weeks  PLANNED INTERVENTIONS: Therapeutic exercises, Therapeutic activity,  Neuromuscular re-education, Balance training, Gait training, Patient/Family education, Self Care, Joint mobilization, Dry Needling, Electrical stimulation, Cryotherapy, Moist heat, scar mobilization, Taping, Traction, Ultrasound, Biofeedback, Ionotophoresis 4mg /ml Dexamethasone, Manual therapy, and Re-evaluation  PLAN FOR NEXT SESSION: f/u on dry needling #2 for muscle spasms in lumbar paraspinals at L5/S1  - add stretches to HEP as needed; add hip flexor stretch; pelvic floor address core and pelvic muscle coordination and find exercise that pt can feel more strength in contraction, LE MMT and functional squat and SLS assessed when able   [employer]R BlockJakki L Cezar Misiaszek, PT 06/14/2022, 9:44 AM

## 2022-06-15 ENCOUNTER — Ambulatory Visit: Payer: Medicare Other | Attending: Gastroenterology | Admitting: Physical Therapy

## 2022-06-15 DIAGNOSIS — M6281 Muscle weakness (generalized): Secondary | ICD-10-CM | POA: Diagnosis present

## 2022-06-15 DIAGNOSIS — R279 Unspecified lack of coordination: Secondary | ICD-10-CM | POA: Diagnosis present

## 2022-06-15 DIAGNOSIS — M62838 Other muscle spasm: Secondary | ICD-10-CM | POA: Insufficient documentation

## 2022-06-20 ENCOUNTER — Ambulatory Visit: Payer: Medicare Other | Admitting: Physical Therapy

## 2022-06-20 DIAGNOSIS — M6281 Muscle weakness (generalized): Secondary | ICD-10-CM | POA: Diagnosis not present

## 2022-06-20 DIAGNOSIS — R279 Unspecified lack of coordination: Secondary | ICD-10-CM

## 2022-06-20 DIAGNOSIS — M62838 Other muscle spasm: Secondary | ICD-10-CM

## 2022-06-20 NOTE — Therapy (Signed)
OUTPATIENT PHYSICAL THERAPY FEMALE PELVICTREATMENT   Patient Name: Adrienne Luna MRN: [id number] DOB:06-Mar-1946, 77 y.o., female Today's Date: 06/20/2022  END OF SESSION:  PT End of Session - 06/20/22 1009     Visit Number 5    Date for PT Re-Evaluation 07/11/22    Authorization Type medicare A/B    Progress Note Due on Visit 10    PT Start Time 1010    PT Stop Time 1056    PT Time Calculation (min) 46 min    Activity Tolerance Patient tolerated treatment well    Behavior During Therapy WFL for tasks assessed/performed                 Past Medical History:  Diagnosis Date   IBS (irritable bowel syndrome)    Past Surgical History:  Procedure Laterality Date   ABDOMINAL HYSTERECTOMY     CESAREAN SECTION     x2   There are no problems to display for this patient.   PCP: Thana Ates, MD   REFERRING PROVIDER: Legrand Como, PA-C   REFERRING DIAG: R32 (ICD-10-CM) - Unspecified urinary incontinence   THERAPY DIAG:  Muscle weakness (generalized)  Other muscle spasm  Unspecified lack of coordination  Rationale for Evaluation and Treatment: Rehabilitation  ONSET DATE: October 2023  SUBJECTIVE:                                                                                                                                                                                           SUBJECTIVE STATEMENT: Pt states leakage is better and not getting up in the middle of the night as often.  If I start having coughing or sneezing I am not leakage Fluid intake: Yes: 6-8 glasses water/day    PAIN:  Are you having pain? No   PRECAUTIONS: None  WEIGHT BEARING RESTRICTIONS: No  FALLS:  Has patient fallen in last 6 months? 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  PLOF: Independent  PATIENT GOALS: control bladder better, be able to  walk  PERTINENT HISTORY:  IBS, hysterectomy, 2 c-sections Sexual abuse: No  BOWEL MOVEMENT: Pain with bowel movement: Yes and bleeding occurs with hemorroids Type of bowel movement:Type (Bristol Stool Scale) hard and short/clumpy, Frequency daily but usually every other day, and Strain Yes Fully empty rectum: No Leakage: No Pads: No Fiber supplement: No  URINATION: Pain with urination: Yes (right lower abdomen - feels like menstrual cramps when bladder) Fully empty bladder: No not sure Stream: Strong Urgency: No Frequency: nocturia 1/night, every  2 hours during the day Leakage: Urge to void, Walking to the bathroom, Coughing, Sneezing, and walking for exercises Pads: Yes: 3-4/day (when I am coughing)  INTERCOURSE: Pain with intercourse: not currently active  PREGNANCY: Vaginal deliveries 1 miscarriage Tearing  C-section deliveries 2   PROLAPSE: None   OBJECTIVE:   DIAGNOSTIC FINDINGS:    PATIENT SURVEYS:    PFIQ-7   COGNITION: Overall cognitive status: Within functional limits for tasks assessed     SENSATION: Light touch: Appears intact Proprioception:   MUSCLE LENGTH: Hamstrings: Right 80 deg; Left 70 deg Thomas test:   LUMBAR SPECIAL TESTS:  Straight leg raise test: Negative  FUNCTIONAL TESTS:    GAIT:  Comments: flexed posture  POSTURE: rounded shoulders, increased lumbar lordosis, increased thoracic kyphosis, and flexed trunk   PELVIC ALIGNMENT:  LUMBARAROM/PROM:  A/PROM A/PROM  eval  Flexion 75%  Extension   Right lateral flexion   Left lateral flexion   Right rotation   Left rotation    (Blank rows = not tested)  LOWER EXTREMITY ROM Left hip 75%  Passive ROM Right eval Left eval  Hip flexion    Hip extension    Hip abduction    Hip adduction    Hip internal rotation    Hip external rotation    Knee flexion    Knee extension    Ankle dorsiflexion    Ankle plantarflexion    Ankle inversion    Ankle eversion      (Blank rows = not tested)  LOWER EXTREMITY MMT: assess next (due to time)  MMT Right eval Left eval  Hip flexion    Hip extension    Hip abduction    Hip adduction    Hip internal rotation    Hip external rotation    Knee flexion    Knee extension    Ankle dorsiflexion    Ankle plantarflexion    Ankle inversion    Ankle eversion     PALPATION:   General  tension but not tender externally, adductors, gluteals, lumbar paraspinals tight, lower abdomen scar tissue restriction                External Perineal Exam - tight ischiocavernosis and transverse peroneus - not tender                             Internal Pelvic Floor vaginal canal felt inflamed and fascial restrictions Rt>Lt, no circular contraction and a lot of co-contracting adductors and lumbar  Patient confirms identification and approves PT to assess internal pelvic floor and treatment Yes  PELVIC MMT:   MMT eval 06/20/22   Vaginal 1/5 2 rep, 1 sec 2/5 x 4 reps, 1 sec difficulty relaxing  Internal Anal Sphincter    External Anal Sphincter    Puborectalis    Diastasis Recti    (Blank rows = not tested)        TONE: Normal to high  PROLAPSE: no  TODAY'S TREATMENT:  DATE: 06/20/22  Theract: Urge techniques  Manual: Patient confirms identification and approves physical therapist to perform internal soft tissue work  - ileococcygeus and obturator on the left side  NMRE Exercises: Breathing into lower abdomen and keeping back relaxed VC and TC Pelvic tilt with breathing Standing hip flexor stretch with posterior pelvic tilt      PATIENT EDUCATION:  Education details: moisturizers and piriformis stretch Person educated: Patient Education method: Explanation and Verbal cues Education comprehension: verbalized understanding and needs further education  HOME EXERCISE  PROGRAM: Access Code: [email or web address] URL: [email or web address] Date: 06/15/2022 Prepared by: Dwana Curd  Exercises - Diaphragmatic Breathing at 90/90 Supported  - 1 x daily - 7 x weekly - 3 sets - 10 reps - Ball squeeze with Kegel  - 3 x daily - 7 x weekly - 1 sets - 10 reps - 3 sec hold  Patient Education - Trigger Point Dry Needling  ASSESSMENT:  CLINICAL IMPRESSION: Pt needed a lot of cues to relax back during exercises.  Pt had good release with manual techniques to pelvic floor as mentioned above.  Pt was given urge techniques for reduced urgency.  Overall, is having good bowel movement at least 5/week.  Pt will benefit from skilled PT to continue to work on coordination and improved strength   OBJECTIVE IMPAIRMENTS: decreased coordination, decreased endurance, decreased ROM, decreased strength, increased muscle spasms, impaired flexibility, impaired tone, postural dysfunction, and pain.   ACTIVITY LIMITATIONS: lifting, continence, toileting, caring for others, and walking for exercise and shopping  PARTICIPATION LIMITATIONS: community activity  PERSONAL FACTORS: Time since onset of injury/illness/exacerbation and 3+ comorbidities: 2 c-sections, IBS, hysterectomy  are also affecting patient's functional outcome.   REHAB POTENTIAL: Excellent  CLINICAL DECISION MAKING: Evolving/moderate complexity  EVALUATION COMPLEXITY: Moderate   GOALS: Goals reviewed with patient? Yes  SHORT TERM GOALS: Target date: 05/18/22  Pt will be ind with initial HEP Baseline: Goal status: MET  2.  Pt will report 50% more ease with bowel movements Baseline: 70% improved Goal status: MET   LONG TERM GOALS: Target date: 07/11/22  Pt will be independent with advanced HEP to maintain improvements made throughout therapy  Baseline:  Goal status: IN PROGRESS  2.  Pt will report her BMs are complete due to improved bowel habits and evacuation techniques.  Baseline: not  feeling finished Goal status: IN PROGRESS  3.  Pt will report 4-5 BMs per week due to improved muscle tone and coordination with BMs.  Baseline: at least 5/week and just drinking senna tea 2x/week Goal status: MET  4.  Pt will be able to functional actions such as walk to the bathroom slowly without leakage  Baseline: happens when sitting too long Goal status: IN PROGRESS  5.  Pt will be able to go on 2-3 mile walks due to not needing to always be close to the bathroom Baseline: cannot because I feel like I need to be close Goal status: IN PROGRESS    PLAN:  PT FREQUENCY: 2x/week  PT DURATION: 12 weeks  PLANNED INTERVENTIONS: Therapeutic exercises, Therapeutic activity, Neuromuscular re-education, Balance training, Gait training, Patient/Family education, Self Care, Joint mobilization, Dry Needling, Electrical stimulation, Cryotherapy, Moist heat, scar mobilization, Taping, Traction, Ultrasound, Biofeedback, Ionotophoresis /ml Dexamethasone, Manual therapy, and Re-evaluation  PLAN FOR NEXT SESSION: posture, lumbar and thoracic length, posterior pelvic tilt, not tensing back with breathing, work on posture strength   [employer], PT 06/20/2022, 11:44 AM

## 2022-06-29 ENCOUNTER — Ambulatory Visit: Payer: Medicare Other | Admitting: Physical Therapy

## 2022-06-29 DIAGNOSIS — M6281 Muscle weakness (generalized): Secondary | ICD-10-CM | POA: Diagnosis not present

## 2022-06-29 DIAGNOSIS — M62838 Other muscle spasm: Secondary | ICD-10-CM

## 2022-06-29 DIAGNOSIS — R279 Unspecified lack of coordination: Secondary | ICD-10-CM

## 2022-06-29 NOTE — Therapy (Signed)
OUTPATIENT PHYSICAL THERAPY FEMALE PELVICTREATMENT   Patient Name: Adrienne Luna MRN: [id number] DOB:[birth year], 77 y.o., female Today's Date: 06/29/2022  END OF SESSION:  PT End of Session - 06/29/22 1024     Visit Number 6    Date for PT Re-Evaluation 07/11/22    Authorization Type medicare A/B    Progress Note Due on Visit 10    PT Start Time 1022    PT Stop Time 1103    PT Time Calculation (min) 41 min    Activity Tolerance Patient tolerated treatment well    Behavior During Therapy WFL for tasks assessed/performed                  Past Medical History:  Diagnosis Date   IBS (irritable bowel syndrome)    Past Surgical History:  Procedure Laterality Date   ABDOMINAL HYSTERECTOMY     CESAREAN SECTION     x2   There are no problems to display for this patient.   PCP: Thana Ates, MD   REFERRING PROVIDER: Legrand Como, PA-C   REFERRING DIAG: R32 (ICD-10-CM) - Unspecified urinary incontinence   THERAPY DIAG:  Muscle weakness (generalized)  Unspecified lack of coordination  Other muscle spasm  Rationale for Evaluation and Treatment: Rehabilitation  ONSET DATE: October 2023  SUBJECTIVE:                                                                                                                                                                                           SUBJECTIVE STATEMENT: Pt has been constipated more than usual and used the tea more.  Not sure why.  Pt had a lot of tension in the neck on the Lt side yesterday Fluid intake: Yes: 6-8 glasses water/day    PAIN:  Are you having pain? No   PRECAUTIONS: None  WEIGHT BEARING RESTRICTIONS: No  FALLS:  Has patient fallen in last 6 months? 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  PLOF: Independent  PATIENT GOALS: control bladder better, be able to  walk  PERTINENT HISTORY:  IBS, hysterectomy, 2 c-sections Sexual abuse: No  BOWEL MOVEMENT: Pain with bowel movement: Yes and bleeding occurs with hemorroids Type of bowel movement:Type (Bristol Stool Scale) hard and short/clumpy, Frequency daily but usually every other day, and Strain Yes Fully empty rectum: No Leakage: No Pads: No Fiber supplement: No  URINATION: Pain with urination: Yes (right lower abdomen - feels like menstrual cramps when bladder) Fully empty bladder: No not sure Stream: Strong Urgency: No Frequency:  nocturia 1/night, every 2 hours during the day Leakage: Urge to void, Walking to the bathroom, Coughing, Sneezing, and walking for exercises Pads: Yes: 3-4/day (when I am coughing)  INTERCOURSE: Pain with intercourse: not currently active  PREGNANCY: Vaginal deliveries 1 miscarriage Tearing  C-section deliveries 2   PROLAPSE: None   OBJECTIVE:   DIAGNOSTIC FINDINGS:    PATIENT SURVEYS:    PFIQ-7   COGNITION: Overall cognitive status: Within functional limits for tasks assessed     SENSATION: Light touch: Appears intact Proprioception:   MUSCLE LENGTH: Hamstrings: Right 80 deg; Left 70 deg Thomas test:   LUMBAR SPECIAL TESTS:  Straight leg raise test: Negative  FUNCTIONAL TESTS:    GAIT:  Comments: flexed posture  POSTURE: rounded shoulders, increased lumbar lordosis, increased thoracic kyphosis, and flexed trunk   PELVIC ALIGNMENT:  LUMBARAROM/PROM:  A/PROM A/PROM  eval  Flexion 75%  Extension   Right lateral flexion   Left lateral flexion   Right rotation   Left rotation    (Blank rows = not tested)  LOWER EXTREMITY ROM Left hip 75%  Passive ROM Right eval Left eval  Hip flexion    Hip extension    Hip abduction    Hip adduction    Hip internal rotation    Hip external rotation    Knee flexion    Knee extension    Ankle dorsiflexion    Ankle plantarflexion    Ankle inversion    Ankle eversion      (Blank rows = not tested)  LOWER EXTREMITY MMT: assess next (due to time)  MMT Right eval Left eval  Hip flexion    Hip extension    Hip abduction    Hip adduction    Hip internal rotation    Hip external rotation    Knee flexion    Knee extension    Ankle dorsiflexion    Ankle plantarflexion    Ankle inversion    Ankle eversion     PALPATION:   General  tension but not tender externally, adductors, gluteals, lumbar paraspinals tight, lower abdomen scar tissue restriction                External Perineal Exam - tight ischiocavernosis and transverse peroneus - not tender                             Internal Pelvic Floor vaginal canal felt inflamed and fascial restrictions Rt>Lt, no circular contraction and a lot of co-contracting adductors and lumbar  Patient confirms identification and approves PT to assess internal pelvic floor and treatment Yes  PELVIC MMT:   MMT eval 06/20/22   Vaginal 1/5 2 rep, 1 sec 2/5 x 4 reps, 1 sec difficulty relaxing  Internal Anal Sphincter    External Anal Sphincter    Puborectalis    Diastasis Recti    (Blank rows = not tested)        TONE: Normal to high  PROLAPSE: no  TODAY'S TREATMENT:  DATE: 06/29/22    Manual: Lumbar and thoracic paraspinals and left levator Trigger Point Dry-Needling  Treatment instructions: Expect mild to moderate muscle soreness. S/S of pneumothorax if dry needled over a lung field, and to seek immediate medical attention should they occur. Patient verbalized understanding of these instructions and education.  Patient Consent Given: Yes Education handout provided: Previously provided Muscles treated: lumbar and thoracic multifidi, left levator scap Electrical stimulation performed: No Parameters: N/A Treatment response/outcome: increased soft tissue length   NMRE  Exercises: Thoracic stretch rotation at wall Rotation 3lb arm machine - 10x bil Shoulder ext 3lb - 20x  Exercises: Vibration plate h/s stretch Hip flexion on vibration plate Breathing into lower abdomen and keeping back relaxed VC and TC Pelvic tilt with breathing Standing hip flexor stretch with posterior pelvic tilt      PATIENT EDUCATION:  Education details: moisturizers and piriformis stretch Person educated: Patient Education method: Explanation and Verbal cues Education comprehension: verbalized understanding and needs further education  HOME EXERCISE PROGRAM: Access Code: [email or web address] URL: [email or web address] Date: 06/15/2022 Prepared by: Dwana Curd  Exercises - Diaphragmatic Breathing at 90/90 Supported  - 1 x daily - 7 x weekly - 3 sets - 10 reps - Ball squeeze with Kegel  - 3 x daily - 7 x weekly - 1 sets - 10 reps - 3 sec hold  Patient Education - Trigger Point Dry Needling  ASSESSMENT:  CLINICAL IMPRESSION: Pt still needing cues for when to exhale and has to take time to think about exercises and coordination of exhale.  Today's session focused on core strength and thoracic mobility with exercise progression. Pt will benefit from skilled PT to continue to work on coordination and improved strength   OBJECTIVE IMPAIRMENTS: decreased coordination, decreased endurance, decreased ROM, decreased strength, increased muscle spasms, impaired flexibility, impaired tone, postural dysfunction, and pain.   ACTIVITY LIMITATIONS: lifting, continence, toileting, caring for others, and walking for exercise and shopping  PARTICIPATION LIMITATIONS: community activity  PERSONAL FACTORS: Time since onset of injury/illness/exacerbation and 3+ comorbidities: 2 c-sections, IBS, hysterectomy  are also affecting patient's functional outcome.   REHAB POTENTIAL: Excellent  CLINICAL DECISION MAKING: Evolving/moderate complexity  EVALUATION COMPLEXITY:  Moderate   GOALS: Goals reviewed with patient? Yes  SHORT TERM GOALS: Target date: 05/18/22  Pt will be ind with initial HEP Baseline: Goal status: MET  2.  Pt will report 50% more ease with bowel movements Baseline: 70% improved Goal status: MET   LONG TERM GOALS: Target date: 07/11/22  Pt will be independent with advanced HEP to maintain improvements made throughout therapy  Baseline:  Goal status: IN PROGRESS  2.  Pt will report her BMs are complete due to improved bowel habits and evacuation techniques.  Baseline: not feeling finished Goal status: IN PROGRESS  3.  Pt will report 4-5 BMs per week due to improved muscle tone and coordination with BMs.  Baseline: at least 5/week and just drinking senna tea 2x/week Goal status: MET  4.  Pt will be able to functional actions such as walk to the bathroom slowly without leakage  Baseline: happens when sitting too long Goal status: IN PROGRESS  5.  Pt will be able to go on 2-3 mile walks due to not needing to always be close to the bathroom Baseline: cannot because I feel like I need to be close Goal status: IN PROGRESS    PLAN:  PT FREQUENCY: 2x/week  PT DURATION: 12 weeks  PLANNED INTERVENTIONS: Therapeutic exercises, Therapeutic activity, Neuromuscular  re-education, Balance training, Gait training, Patient/Family education, Self Care, Joint mobilization, Dry Needling, Electrical stimulation, Cryotherapy, Moist heat, scar mobilization, Taping, Traction, Ultrasound, Biofeedback, Ionotophoresis /ml Dexamethasone, Manual therapy, and Re-evaluation  PLAN FOR NEXT SESSION: posture, lumbar and thoracic length, posterior pelvic tilt, not tensing back with breathing, work on posture strength   [employer], PT 06/29/2022, 11:44 AM

## 2022-07-04 ENCOUNTER — Ambulatory Visit: Payer: Medicare Other | Admitting: Physical Therapy

## 2022-07-04 DIAGNOSIS — M6281 Muscle weakness (generalized): Secondary | ICD-10-CM | POA: Diagnosis not present

## 2022-07-04 DIAGNOSIS — R279 Unspecified lack of coordination: Secondary | ICD-10-CM

## 2022-07-04 DIAGNOSIS — M62838 Other muscle spasm: Secondary | ICD-10-CM

## 2022-07-04 NOTE — Therapy (Signed)
OUTPATIENT PHYSICAL THERAPY FEMALE PELVICTREATMENT   Patient Name: Lataunya Ruud MRN: [id number] DOB:04/10/1945, 77 y.o., female Today's Date: 07/04/2022  END OF SESSION:  PT End of Session - 07/04/22 1014     Visit Number 7    Date for PT Re-Evaluation 09/26/22    Authorization Type medicare A/B    Progress Note Due on Visit 10    PT Start Time 1015    PT Stop Time 1100    PT Time Calculation (min) 45 min    Activity Tolerance Patient tolerated treatment well    Behavior During Therapy WFL for tasks assessed/performed                   Past Medical History:  Diagnosis Date   IBS (irritable bowel syndrome)    Past Surgical History:  Procedure Laterality Date   ABDOMINAL HYSTERECTOMY     CESAREAN SECTION     x2   There are no problems to display for this patient.   PCP: Thana Ates, MD   REFERRING PROVIDER: Legrand Como, PA-C   REFERRING DIAG: R32 (ICD-10-CM) - Unspecified urinary incontinence   THERAPY DIAG:  Muscle weakness (generalized)  Unspecified lack of coordination  Other muscle spasm  Rationale for Evaluation and Treatment: Rehabilitation  ONSET DATE: October 2023  SUBJECTIVE:                                                                                                                                                                                           SUBJECTIVE STATEMENT: Pt states she feels like she had gone backwards this last week.  More urgency, more nocturia, and more constipation.  Nothing has been different and everything seemed like it was going in the right direction before this week Fluid intake: Yes: 6-8 glasses water/day    PAIN:  Are you having pain? No   PRECAUTIONS: None  WEIGHT BEARING RESTRICTIONS: No  FALLS:  Has patient fallen in last 6 months? 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  PLOF: Independent  PATIENT GOALS: control bladder better, be able to walk  PERTINENT HISTORY:  IBS, hysterectomy, 2 c-sections Sexual abuse: No  BOWEL MOVEMENT: Pain with bowel movement: Yes and bleeding occurs with hemorroids Type of bowel movement:Type (Bristol Stool Scale) hard and short/clumpy, Frequency daily but usually every other day, and Strain Yes Fully empty rectum: No Leakage: No Pads: No Fiber supplement: No  URINATION: Pain with urination: Yes (right lower abdomen - feels like menstrual cramps when bladder) Fully empty  bladder: No not sure Stream: Strong Urgency: No Frequency: nocturia 1/night, every 2 hours during the day Leakage: Urge to void, Walking to the bathroom, Coughing, Sneezing, and walking for exercises Pads: Yes: 3-4/day (when I am coughing)  INTERCOURSE: Pain with intercourse: not currently active  PREGNANCY: Vaginal deliveries 1 miscarriage Tearing  C-section deliveries 2   PROLAPSE: None   OBJECTIVE:   DIAGNOSTIC FINDINGS:    PATIENT SURVEYS:    PFIQ-7   COGNITION: Overall cognitive status: Within functional limits for tasks assessed     SENSATION: Light touch: Appears intact Proprioception:   MUSCLE LENGTH: Hamstrings: Right 80 deg; Left 70 deg Thomas test:   LUMBAR SPECIAL TESTS:  Straight leg raise test: Negative  FUNCTIONAL TESTS:    GAIT:  Comments: flexed posture  POSTURE: rounded shoulders, increased lumbar lordosis, increased thoracic kyphosis, and flexed trunk   PELVIC ALIGNMENT:  LUMBARAROM/PROM:  A/PROM A/PROM  eval  Flexion 75%  Extension   Right lateral flexion   Left lateral flexion   Right rotation   Left rotation    (Blank rows = not tested)  LOWER EXTREMITY ROM Left hip 75%  Passive ROM Right eval Left eval  Hip flexion    Hip extension    Hip abduction    Hip adduction    Hip internal rotation    Hip external rotation    Knee flexion    Knee extension     Ankle dorsiflexion    Ankle plantarflexion    Ankle inversion    Ankle eversion     (Blank rows = not tested)  LOWER EXTREMITY MMT: assess next (due to time)  MMT Right eval Left eval  Hip flexion    Hip extension    Hip abduction    Hip adduction    Hip internal rotation    Hip external rotation    Knee flexion    Knee extension    Ankle dorsiflexion    Ankle plantarflexion    Ankle inversion    Ankle eversion     PALPATION:   General  tension but not tender externally, adductors, gluteals, lumbar paraspinals tight, lower abdomen scar tissue restriction                External Perineal Exam - tight ischiocavernosis and transverse peroneus - not tender                             Internal Pelvic Floor vaginal canal felt inflamed and fascial restrictions Rt>Lt, no circular contraction and a lot of co-contracting adductors and lumbar  Patient confirms identification and approves PT to assess internal pelvic floor and treatment Yes  PELVIC MMT:   MMT eval 06/20/22   Vaginal 1/5 2 rep, 1 sec 2/5 x 4 reps, 1 sec difficulty relaxing  Internal Anal Sphincter    External Anal Sphincter    Puborectalis    Diastasis Recti    (Blank rows = not tested)        TONE: Normal to high  PROLAPSE: no  TODAY'S TREATMENT:  DATE: 07/04/22    Manual: Lumbar and thoracic paraspinals, bil gluteals and adductors and external pelvic floor Trigger Point Dry-Needling  Treatment instructions: Expect mild to moderate muscle soreness. S/S of pneumothorax if dry needled over a lung field, and to seek immediate medical attention should they occur. Patient verbalized understanding of these instructions and education.  Patient Consent Given: Yes Education handout provided: Previously provided Muscles treated: lumbar and bil gluteals Electrical stimulation performed:  No Parameters: N/A Treatment response/outcome: increased soft tissue length   Exercises: Thoracic ext with ball Ext with arms overhead Nustep L5 x 6 min - PT present for status update     PATIENT EDUCATION:  Education details: moisturizers and piriformis stretch Person educated: Patient Education method: Explanation and Verbal cues Education comprehension: verbalized understanding and needs further education  HOME EXERCISE PROGRAM: Access Code: [email or web address] URL: [email or web address] Date: 06/15/2022 Prepared by: Dwana Curd  Exercises - Diaphragmatic Breathing at 90/90 Supported  - 1 x daily - 7 x weekly - 3 sets - 10 reps - Ball squeeze with Kegel  - 3 x daily - 7 x weekly - 1 sets - 10 reps - 3 sec hold  Patient Education - Trigger Point Dry Needling  ASSESSMENT:  CLINICAL IMPRESSION: Pt was making good progress but recently experienced a set back.  Pt still has a lot tension and more on the Rt adductors and pelvic floor.  Pt was tender to palpation around transverse peroneus and ischial cavernosis.  Pt did not tolerate internal soft tissue assessment as she was feeling vaginal irritation today.  Pt is making slow progress due to setback but is expected to achieve functional goals with further PT. Pt will benefit from skilled PT to continue to work on coordination and improved strength   OBJECTIVE IMPAIRMENTS: decreased coordination, decreased endurance, decreased ROM, decreased strength, increased muscle spasms, impaired flexibility, impaired tone, postural dysfunction, and pain.   ACTIVITY LIMITATIONS: lifting, continence, toileting, caring for others, and walking for exercise and shopping  PARTICIPATION LIMITATIONS: community activity  PERSONAL FACTORS: Time since onset of injury/illness/exacerbation and 3+ comorbidities: 2 c-sections, IBS, hysterectomy  are also affecting patient's functional outcome.   REHAB POTENTIAL: Excellent  CLINICAL  DECISION MAKING: Evolving/moderate complexity  EVALUATION COMPLEXITY: Moderate   GOALS: Goals reviewed with patient? Yes  SHORT TERM GOALS: Target date: 05/18/22  Pt will be ind with initial HEP Baseline: Goal status: MET  2.  Pt will report 50% more ease with bowel movements Baseline: 70% improved Goal status: MET   LONG TERM GOALS: Target date: 07/11/22  Pt will be independent with advanced HEP to maintain improvements made throughout therapy  Baseline:  Goal status: IN PROGRESS  2.  Pt will report her BMs are complete due to improved bowel habits and evacuation techniques.  Baseline: not feeling finished Goal status: IN PROGRESS  3.  Pt will report 4-5 BMs per week due to improved muscle tone and coordination with BMs.  Baseline: at least 5/week and just drinking senna tea 2x/week Goal status: MET  4.  Pt will be able to functional actions such as walk to the bathroom slowly without leakage  Baseline: happens when sitting too long Goal status: IN PROGRESS  5.  Pt will be able to go on 2-3 mile walks due to not needing to always be close to the bathroom Baseline: had a hard time getting chores done this week because of urgency Goal status: IN PROGRESS    PLAN:  PT FREQUENCY: 2x/week  PT DURATION:  12 weeks  PLANNED INTERVENTIONS: Therapeutic exercises, Therapeutic activity, Neuromuscular re-education, Balance training, Gait training, Patient/Family education, Self Care, Joint mobilization, Dry Needling, Electrical stimulation, Cryotherapy, Moist heat, scar mobilization, Taping, Traction, Ultrasound, Biofeedback, Ionotophoresis 4mg /ml Dexamethasone, Manual therapy, and Re-evaluation  PLAN FOR NEXT SESSION: posture, lumbar and thoracic length, not tensing back with breathing, work on posture strength   [employer], PT 07/04/2022, 12:21 PM

## 2022-07-12 ENCOUNTER — Ambulatory Visit: Payer: Medicare Other | Attending: Gastroenterology | Admitting: Physical Therapy

## 2022-07-12 DIAGNOSIS — M62838 Other muscle spasm: Secondary | ICD-10-CM | POA: Diagnosis present

## 2022-07-12 DIAGNOSIS — M6281 Muscle weakness (generalized): Secondary | ICD-10-CM

## 2022-07-12 DIAGNOSIS — R279 Unspecified lack of coordination: Secondary | ICD-10-CM | POA: Diagnosis present

## 2022-07-12 NOTE — Therapy (Signed)
OUTPATIENT PHYSICAL THERAPY FEMALE PELVICTREATMENT   Patient Name: Treasea Spolar MRN: [id number] DOB:[birth year], 77 y.o., female Today's Date: 07/12/2022  END OF SESSION:  PT End of Session - 07/12/22 1546     Visit Number 8    Date for PT Re-Evaluation 09/26/22    Authorization Type medicare A/B    Progress Note Due on Visit 10    PT Start Time 1533    PT Stop Time 1617    PT Time Calculation (min) 44 min    Activity Tolerance Patient tolerated treatment well    Behavior During Therapy WFL for tasks assessed/performed                    Past Medical History:  Diagnosis Date   IBS (irritable bowel syndrome)    Past Surgical History:  Procedure Laterality Date   ABDOMINAL HYSTERECTOMY     CESAREAN SECTION     x2   There are no problems to display for this patient.   PCP: Thana Ates, MD   REFERRING PROVIDER: Legrand Como, PA-C   REFERRING DIAG: R32 (ICD-10-CM) - Unspecified urinary incontinence   THERAPY DIAG:  Muscle weakness (generalized)  Unspecified lack of coordination  Other muscle spasm  Rationale for Evaluation and Treatment: Rehabilitation  ONSET DATE: October 2023  SUBJECTIVE:                                                                                                                                                                                           SUBJECTIVE STATEMENT: Pt states constipation is better but  nocturia is worse (3/night).  My neck and hip pain got bad and took tylenol and ibuprofen. Fluid intake: Yes: 6-8 glasses water/day    PAIN:  PAIN:  Are you having pain? Yes NPRS scale: 6-7/10 (yesterday was a 10) Pain location: neck and hip and hands Pain orientation: Left  PAIN TYPE: aching Pain description: constant  Aggravating factors: not sure Relieving factors: tylenol/ibuprofen cocktail    PRECAUTIONS: None  WEIGHT BEARING RESTRICTIONS: No  FALLS:  Has patient fallen in last 6 months?  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  PLOF: Independent  PATIENT GOALS: control bladder better, be able to walk  PERTINENT HISTORY:  IBS, hysterectomy, 2 c-sections Sexual abuse: No  BOWEL MOVEMENT: Pain with bowel movement: Yes and bleeding occurs with hemorroids Type of bowel movement:Type (Bristol Stool Scale) hard and short/clumpy, Frequency daily but usually every other day, and Strain Yes Fully empty rectum: No Leakage: No Pads:  No Fiber supplement: No  URINATION: Pain with urination: Yes (right lower abdomen - feels like menstrual cramps when bladder) Fully empty bladder: No not sure Stream: Strong Urgency: No Frequency: nocturia 1/night, every 2 hours during the day Leakage: Urge to void, Walking to the bathroom, Coughing, Sneezing, and walking for exercises Pads: Yes: 3-4/day (when I am coughing)  INTERCOURSE: Pain with intercourse: not currently active  PREGNANCY: Vaginal deliveries 1 miscarriage Tearing  C-section deliveries 2   PROLAPSE: None   OBJECTIVE:   DIAGNOSTIC FINDINGS:    PATIENT SURVEYS:    PFIQ-7   COGNITION: Overall cognitive status: Within functional limits for tasks assessed     SENSATION: Light touch: Appears intact Proprioception:   MUSCLE LENGTH: Hamstrings: Right 80 deg; Left 70 deg Thomas test:   LUMBAR SPECIAL TESTS:  Straight leg raise test: Negative  FUNCTIONAL TESTS:    GAIT:  Comments: flexed posture  POSTURE: rounded shoulders, increased lumbar lordosis, increased thoracic kyphosis, and flexed trunk   PELVIC ALIGNMENT:  LUMBARAROM/PROM:  A/PROM A/PROM  eval  Flexion 75%  Extension   Right lateral flexion   Left lateral flexion   Right rotation   Left rotation    (Blank rows = not tested)  LOWER EXTREMITY ROM Left hip 75%  Passive ROM Right eval Left eval  Hip flexion     Hip extension    Hip abduction    Hip adduction    Hip internal rotation    Hip external rotation    Knee flexion    Knee extension    Ankle dorsiflexion    Ankle plantarflexion    Ankle inversion    Ankle eversion     (Blank rows = not tested)  LOWER EXTREMITY MMT: assess next (due to time)  MMT Right eval Left eval  Hip flexion    Hip extension    Hip abduction    Hip adduction    Hip internal rotation    Hip external rotation    Knee flexion    Knee extension    Ankle dorsiflexion    Ankle plantarflexion    Ankle inversion    Ankle eversion     PALPATION:   General  tension but not tender externally, adductors, gluteals, lumbar paraspinals tight, lower abdomen scar tissue restriction                External Perineal Exam - tight ischiocavernosis and transverse peroneus - not tender                             Internal Pelvic Floor vaginal canal felt inflamed and fascial restrictions Rt>Lt, no circular contraction and a lot of co-contracting adductors and lumbar  Patient confirms identification and approves PT to assess internal pelvic floor and treatment Yes  PELVIC MMT:   MMT eval 06/20/22   Vaginal 1/5 2 rep, 1 sec 2/5 x 4 reps, 1 sec difficulty relaxing  Internal Anal Sphincter    External Anal Sphincter    Puborectalis    Diastasis Recti    (Blank rows = not tested)        TONE: Normal to high  PROLAPSE: no  TODAY'S TREATMENT:  DATE: 07/12/22    Manual: Lumbar and thoracic paraspinals, bil gluteals and adductors and external pelvic floor Trigger Point Dry-Needling  Treatment instructions: Expect mild to moderate muscle soreness. S/S of pneumothorax if dry needled over a lung field, and to seek immediate medical attention should they occur. Patient verbalized understanding of these instructions and education.  Patient  Consent Given: Yes Education handout provided: Previously provided Muscles treated: lumbar and bil gluteals Electrical stimulation performed: No Parameters: N/A Treatment response/outcome: increased soft tissue length  Theract: Moisturizers and how to apply to get some stretching of muscles    PATIENT EDUCATION:  Education details: moisturizers and piriformis stretch Person educated: Patient Education method: Explanation and Verbal cues Education comprehension: verbalized understanding and needs further education  HOME EXERCISE PROGRAM: Access Code: [email or web address] URL: [email or web address] Date: 06/15/2022 Prepared by: Dwana Curd  Exercises - Diaphragmatic Breathing at 90/90 Supported  - 1 x daily - 7 x weekly - 3 sets - 10 reps - Ball squeeze with Kegel  - 3 x daily - 7 x weekly - 1 sets - 10 reps - 3 sec hold  Patient Education - Trigger Point Dry Needling  ASSESSMENT:  CLINICAL IMPRESSION: Pt has very tight and shortened pelvic floor muscles and some dryness around perineum.  Pt was educated on moisturize and self stretch . Internal soft tissue work done to continue to work on improved pelvic floor length and muscle function.  Pt will benefit from skilled PT to continue to work on coordination and improved strength   OBJECTIVE IMPAIRMENTS: decreased coordination, decreased endurance, decreased ROM, decreased strength, increased muscle spasms, impaired flexibility, impaired tone, postural dysfunction, and pain.   ACTIVITY LIMITATIONS: lifting, continence, toileting, caring for others, and walking for exercise and shopping  PARTICIPATION LIMITATIONS: community activity  PERSONAL FACTORS: Time since onset of injury/illness/exacerbation and 3+ comorbidities: 2 c-sections, IBS, hysterectomy  are also affecting patient's functional outcome.   REHAB POTENTIAL: Excellent  CLINICAL DECISION MAKING: Evolving/moderate complexity  EVALUATION COMPLEXITY:  Moderate   GOALS: Goals reviewed with patient? Yes  SHORT TERM GOALS: Target date: 05/18/22  Pt will be ind with initial HEP Baseline: Goal status: MET  2.  Pt will report 50% more ease with bowel movements Baseline: 70% improved Goal status: MET   LONG TERM GOALS: Target date: 09/26/22  Pt will be independent with advanced HEP to maintain improvements made throughout therapy  Baseline:  Goal status: IN PROGRESS  2.  Pt will report her BMs are complete due to improved bowel habits and evacuation techniques.  Baseline: not feeling finished Goal status: IN PROGRESS  3.  Pt will report 4-5 BMs per week due to improved muscle tone and coordination with BMs.  Baseline: at least 5/week and just drinking senna tea 2x/week Goal status: MET  4.  Pt will be able to functional actions such as walk to the bathroom slowly without leakage  Baseline: had leakage when waiting too long, can not wait longer than 2 hours Goal status: IN PROGRESS  5.  Pt will be able to go on 2-3 mile walks due to not needing to always be close to the bathroom Baseline: I have done 30 minute walks to an hour was pushing the limit Goal status: IN PROGRESS    PLAN:  PT FREQUENCY: 2x/week  PT DURATION: 12 weeks  PLANNED INTERVENTIONS: Therapeutic exercises, Therapeutic activity, Neuromuscular re-education, Balance training, Gait training, Patient/Family education, Self Care, Joint mobilization, Dry Needling, Electrical stimulation, Cryotherapy, Moist heat, scar mobilization, Taping, Traction, Ultrasound,  Biofeedback, Ionotophoresis 4mg /ml Dexamethasone, Manual therapy, and Re-evaluation  PLAN FOR NEXT SESSION: posture, lumbar and thoracic length, not tensing back with breathing, work on posture strength, f/u on moisturizing   Jakki L Shizue Kaseman, PT 07/12/2022, 4:18 PM

## 2022-09-22 ENCOUNTER — Ambulatory Visit: Payer: Medicare Other | Admitting: Neurology

## 2022-10-18 ENCOUNTER — Other Ambulatory Visit: Payer: Self-pay

## 2022-10-18 ENCOUNTER — Emergency Department (HOSPITAL_BASED_OUTPATIENT_CLINIC_OR_DEPARTMENT_OTHER): Payer: Medicare Other | Admitting: Radiology

## 2022-10-18 ENCOUNTER — Emergency Department (HOSPITAL_BASED_OUTPATIENT_CLINIC_OR_DEPARTMENT_OTHER)
Admission: EM | Admit: 2022-10-18 | Discharge: 2022-10-18 | Disposition: A | Discharge status: Home / Self Care | Payer: Medicare Other | Attending: Emergency Medicine | Admitting: Emergency Medicine

## 2022-10-18 DIAGNOSIS — R0789 Other chest pain: Secondary | ICD-10-CM | POA: Diagnosis present

## 2022-10-18 DIAGNOSIS — M62838 Other muscle spasm: Secondary | ICD-10-CM | POA: Diagnosis not present

## 2022-10-18 DIAGNOSIS — R079 Chest pain, unspecified: Secondary | ICD-10-CM

## 2022-10-18 LAB — BASIC METABOLIC PANEL
Anion gap: 10 (ref 5–15)
BUN: 15 mg/dL (ref 8–23)
CO2: 25 mmol/L (ref 22–32)
Calcium: 9.5 mg/dL (ref 8.9–10.3)
Chloride: 106 mmol/L (ref 98–111)
Creatinine, Ser: 0.89 mg/dL (ref 0.44–1.00)
GFR, Estimated: >60 mL/min (ref >60)
Glucose, Bld: 107 mg/dL — ABNORMAL HIGH (ref 70–99)
Potassium: 4 mmol/L (ref 3.5–5.1)
Sodium: 141 mmol/L (ref 135–145)

## 2022-10-18 LAB — CBC
HCT: 40 % (ref 36.0–46.0)
Hemoglobin: 13.5 g/dL (ref 12.0–15.0)
MCH: 33.4 pg (ref 26.0–34.0)
MCHC: 33.8 g/dL (ref 30.0–36.0)
MCV: 99 fL (ref 80.0–100.0)
Platelets: 241 10*3/uL (ref 150–400)
RBC: 4.04 MIL/uL (ref 3.87–5.11)
RDW: 12.8 % (ref 11.5–15.5)
WBC: 7.3 10*3/uL (ref 4.0–10.5)
nRBC: 0 % (ref 0.0–0.2)

## 2022-10-18 LAB — TROPONIN I (HIGH SENSITIVITY)
Troponin I (High Sensitivity): 2 ng/L (ref <18)
Troponin I (High Sensitivity): 2 ng/L (ref <18)

## 2022-10-18 MED ORDER — METHOCARBAMOL 500 MG PO TABS
500.0000 mg | ORAL_TABLET | Freq: Once | ORAL | Status: AC
  Administered 2022-10-18: 500 mg via ORAL
  Filled 2022-10-18: qty 1

## 2022-10-18 MED ORDER — METHOCARBAMOL 500 MG PO TABS: 500.0000 mg | ORAL_TABLET | Freq: Two times a day (BID) | ORAL | 0 refills | Status: DC | PRN

## 2022-10-18 NOTE — ED Notes (Signed)
Reviewed AVS with patient, patient expressed understanding of directions, denies further questions at this time. 

## 2022-10-18 NOTE — ED Notes (Signed)
Patient resting quietly in stretcher, respirations even, unlabored, no acute distress noted. Denies needs at this time.  

## 2022-10-18 NOTE — ED Notes (Addendum)
Patient resting quietly in stretcher, respirations even, unlabored, no acute distress noted. Endorses chest pain radiating to her left shoulder blade, increased SOB with daily activities, states sometimes the pain causes nausea. Concerned for a blood clot as she's had these symptoms before related to a blood clot. States her PMD did blood work to check for a clot but everything came back okay, the only thing that was not done was an ultrasound. States she was set to start a weight loss medication soon but is now worried that she is not in the best health to do that.

## 2022-10-18 NOTE — ED Notes (Signed)
Additional blue top and gold top sent with labs.

## 2022-10-18 NOTE — ED Provider Notes (Signed)
[facility] AT [facility] Provider Note   CSN: [id number] Arrival date & time: 10/18/22  1635     History  Chief Complaint  Patient presents with   Chest Pain    Adrienne Luna is a 77 y.o. female.  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  Patient reports her symptoms started yesterday and she describes it as a chest tightness that goes into her back and shoulders significant left-sided shoulder pain that also goes down into the arm.  Deep breaths does not seem to affect the pain but certain positions and movement is uncomfortable.  She has felt some intermittent shortness of breath as well as some nausea but has not had any vomiting.  Seems to come in waves but when she has the pain it will last for over an hour.  The heating pad helps some but she has not tried any other medications.  She does have known issues with neck problems but has not had follow-up yet for injection or ablation.  She denies any abdominal pain or diarrhea.  No known heart history.  She did follow-up with her doctor because she had a surgery to her hand several weeks ago and since the surgery had felt like her legs were restless.  She had a D-dimer less than a week ago that was negative.  The history is provided by the patient.  Chest Pain      Home Medications Prior to Admission medications   Medication Sig Start Date End Date Taking? Authorizing Provider  methocarbamol (ROBAXIN) 500 MG tablet Take 1 tablet (500 mg total) by mouth 2 (two) times daily as needed for muscle spasms. 10/18/22  Yes Niamh Rada, Alphonzo Lemmings, MD  esomeprazole (NEXIUM) 10 MG packet Take 10 mg by mouth daily before breakfast.    [provider]  linaCLOtide (LINZESS PO) Take by mouth.    [provider]  meloxicam (MOBIC) 7.5 MG tablet Take 7.5  mg by mouth daily.    [provider]  polyethylene glycol (MIRALAX / GLYCOLAX) 17 g packet Take 17 g by mouth daily.    [provider]  rosuvastatin (CRESTOR) 10 MG tablet Take 10 mg by mouth daily.    [provider]      Allergies    Codeine and Demerol [meperidine hcl]    Review of Systems   Review of Systems  Cardiovascular:  Positive for chest pain.    Physical Exam Updated Vital Signs BP (!) 140/81   Pulse (!) 58   Temp 98 F (36.7 C) (Oral)   Resp 18   Ht 5' 3.5" (1.613 m)   Wt 76.2 kg   SpO2 99%   BMI 29.29 kg/m  Physical Exam Vitals and nursing note reviewed.  Constitutional:      General: She is not in acute distress.    Appearance: She is well-developed.  HENT:     Head: Normocephalic and atraumatic.  Eyes:     Pupils: Pupils are equal, round, and reactive to light.  Cardiovascular:     Rate and Rhythm: Normal rate and regular rhythm.     Heart sounds: Normal heart sounds. No murmur heard.    No friction rub.  Pulmonary:     Effort: Pulmonary effort is normal.     Breath sounds: Normal breath sounds. No wheezing  or rales.  Abdominal:     General: Bowel sounds are normal. There is no distension.     Palpations: Abdomen is soft.     Tenderness: There is no abdominal tenderness. There is no guarding or rebound.  Musculoskeletal:        General: Tenderness present. Normal range of motion.     Comments: No edema.  No calf swelling or tenderness.  Significant pain with palpation in the left scapular area with palpable muscle spasm.  Milder pain in the right scapular area.  2+ radial pulse bilaterally.  Sensation and strength of the left upper extremity is intact  Skin:    General: Skin is warm and dry.     Findings: No rash.  Neurological:     Mental Status: She is alert and oriented to person, place, and time.     Cranial Nerves: No cranial nerve deficit.  Psychiatric:        Behavior: Behavior normal.     ED Results /  Procedures / Treatments   Labs (all labs ordered are listed, but only abnormal results are displayed) Labs Reviewed  BASIC METABOLIC PANEL - Abnormal; Notable for the following components:      Result Value   Glucose, Bld 107 (*)    All other components within normal limits  CBC  TROPONIN I (HIGH SENSITIVITY)  TROPONIN I (HIGH SENSITIVITY)    EKG EKG Interpretation Date/Time:  Tuesday October 18 2022 17:00:37 EDT Ventricular Rate:  71 PR Interval:  152 QRS Duration:  70 QT Interval:  392 QTC Calculation: 425 R Axis:   35  Text Interpretation: Normal sinus rhythm with sinus arrhythmia Normal ECG No previous ECGs available Confirmed by Gwyneth Sprout ([id number]) on 10/18/2022 7:18:21 PM  Radiology DG Chest 2 View  Result Date: 10/18/2022 CLINICAL DATA:  Chest pain. Fluctuating heart rate. Central chest tightness radiating to the left shoulder. Shortness of breath. EXAM: CHEST - 2 VIEW COMPARISON:  01/15/2022 FINDINGS: Heart size and pulmonary vascularity are normal. Lungs are clear. No pleural effusions. No pneumothorax. Mediastinal contours appear intact. Calcified and tortuous aorta. Surgical clips in the right upper quadrant. IMPRESSION: No active cardiopulmonary disease. Electronically Signed   By: Burman Nieves M.D.   On: 10/18/2022 18:04    Procedures Procedures    Medications Ordered in ED Medications  methocarbamol (ROBAXIN) tablet 500 mg (500 mg Oral Given 10/18/22 2016)    ED Course/ Medical Decision Making/ A&P                                 Medical Decision Making Amount and/or Complexity of Data Reviewed Labs: ordered. Radiology: ordered.  Risk Prescription drug management.   Pt with multiple medical problems and comorbidities and presenting today with a complaint that caries a high risk for morbidity and mortality.  Here today with multiple symptoms.  Concern for ACS and will rule out.  Low suspicion for PE at this time given patient's normal vital  signs, nature of pain and a negative D-dimer less than a week ago without any leg pain or swelling.  Low suspicion for dissection.  Suspect most likely musculoskeletal.  No findings to suggest infectious etiology or upper extremity DVT as there is no swelling in the upper extremities.  I independently interpreted patient's labs and EKG.  EKG is normal, troponin x 2 is negative at 2, BMP within normal limits, CBC without acute findings.  I  have independently visualized and interpreted pt's images today.  Chest x-ray within normal limits.  Findings discussed with the patient and her daughter.  Again very low suspicion for PE given negative D-dimer and patient's current symptoms.  Discussed with patient repeating D-dimer versus doing a CTA but feel that this is low yield.  Shared decision making with the patient she was given Robaxin with some improvement in her discomfort.  She remained hemodynamically stable.  She was discharged home with Robaxin, Voltaren gel and Tylenol as needed.  Encouraged follow-up and given return precautions.          Final Clinical Impression(s) / ED Diagnoses Final diagnoses:  Muscle spasm  Nonspecific chest pain    Rx / DC Orders ED Discharge Orders          Ordered    methocarbamol (ROBAXIN) 500 MG tablet  2 times daily PRN        10/18/22 2122              Gwyneth Sprout, MD 10/19/22 0001

## 2022-10-18 NOTE — ED Triage Notes (Signed)
Reports fluctuating HR- normally 50's HR- has been 90's today. Chest tightness central radiating into left shoulder-blade. SOB with exertion. +N/-V.  HX recent hand surgery in July and DVT- no longer on eliquis.

## 2022-10-18 NOTE — Discharge Instructions (Signed)
All the blood work to your heart was normal.  No signs of abnormal rhythms on your EKG.  Everything with your kidneys and electrolytes was normal and within normal D-dimer not long ago would be very unusual that this were a blood clot.  Seems like it is most likely related to your muscles may be coming from your neck.  Continue using your heating pad you can also try Voltaren gel.  You can use the muscle relaxer as needed but it still okay to take Tylenol.

## 2022-10-21 ENCOUNTER — Ambulatory Visit: Payer: Medicare Other | Attending: Gastroenterology | Admitting: Physical Therapy

## 2022-10-21 ENCOUNTER — Other Ambulatory Visit: Payer: Self-pay

## 2022-10-21 DIAGNOSIS — M25552 Pain in left hip: Secondary | ICD-10-CM

## 2022-10-21 DIAGNOSIS — M6281 Muscle weakness (generalized): Secondary | ICD-10-CM | POA: Diagnosis present

## 2022-10-21 DIAGNOSIS — R262 Difficulty in walking, not elsewhere classified: Secondary | ICD-10-CM

## 2022-10-21 NOTE — Therapy (Signed)
OUTPATIENT PHYSICAL THERAPY LOWER EXTREMITY EVALUATION   Patient Name: Adrienne Luna MRN: [id number] DOB:1945/06/21, 77 y.o., female Today's Date: 10/21/2022  END OF SESSION:  PT End of Session - 10/21/22 0848     Visit Number 1    Date for PT Re-Evaluation 12/16/22    Authorization Type medicare A/B    Progress Note Due on Visit 10    PT Start Time 0845    PT Stop Time 0925    PT Time Calculation (min) 40 min    Activity Tolerance Patient tolerated treatment well             Past Medical History:  Diagnosis Date   IBS (irritable bowel syndrome)    Past Surgical History:  Procedure Laterality Date   ABDOMINAL HYSTERECTOMY     CESAREAN SECTION     x2   There are no problems to display for this patient.   PCP: Hillard Danker MD  REFERRING PROVIDER:  Hillard Danker MD  REFERRING DIAG: M25.552 pain in left hip  THERAPY DIAG:  Left hip pain; weakness  Rationale for Evaluation and Treatment: Rehabilitation  ONSET DATE: Jan 2024  SUBJECTIVE:   SUBJECTIVE STATEMENT: My left hip still hurts.  I have a lot going on too though.   Went to ED for this week to rule out heart attack and get checked out since I also have a history of a blood clot.  It's wearing me down, chest pain, nausea.  I'm still in limbo.  I can't get an appt with primary care.  Lack of energy to walk more than pain,   I do AM stretches before getting out of bed but I'm a "couch potato" now and have gained 10 pounds.   I bought an $8,000 adjustable bed but my husband likes it elevated and that doesn't feel good to my hip. PERTINENT HISTORY: Recent right carpal tunnel surgery 1 month ago Occipital neuralgia; cervical stenosis (nerve ablation) GERD (Nexium);  preventive med for BP;  aspirin Surgery in August colonectomy had a blood clot in leg Vit D3 Night take Prevastatin Husband has medical issues and she's been caregiver 4 foot surgeries on right foot left meniscus tear 2022 had injections   PAIN:   Are you having pain? Yes NPRS scale: 5/10 Pain location: left lateral hip  Aggravating factors: avoids stairs; not walking much b/c of decreased energy more than pain; sleeping on my left side; standing to cook a meal takes 2-3 hours Relieving factors: unsure  PRECAUTIONS: None   WEIGHT BEARING RESTRICTIONS: No  FALLS:  Has patient fallen in last 6 months? No  LIVING ENVIRONMENT: Lives with: lives with their spouse Lives in: House/apartment    OCCUPATION: retired  PLOF: Independent  PATIENT GOALS: I want to walk 20-30 min (1-2 miles); play for 1 hour with grandchildren;  take grandchildren to [employer] jumping park  NEXT MD VISIT: not scheduled  OBJECTIVE:    PATIENT SURVEYS:  FOTO 38%  COGNITION: Overall cognitive status: Within functional limits for tasks assessed     PALPATION: Tender points in left gluteals, left ITB and left greater trochanteric bursa  LOWER EXTREMITY ROM: grossly WFLs except decreased left hip external rotation 35 degrees vs. 45 on right;  decreased left hip flexor length  LOWER EXTREMITY MMT:  MMT Right eval Left eval  Hip flexion 5 4  Hip extension 5 4  Hip abduction 4- 4-  Hip adduction    Hip internal rotation    Hip external rotation  4 4  Knee flexion    Knee extension 5 4  Ankle dorsiflexion    Ankle plantarflexion    Ankle inversion    Ankle eversion     (Blank rows = not tested)  LOWER EXTREMITY SPECIAL TESTS:  SLS:  5 sec on right with moderate lateral trunk lean;  5 sec on left with pelvic drop  FUNCTIONAL TESTS:  5x sit to stand 18.51 no UE use required To pick up small item from floor uses hand on thigh to push up to rise Able to transfer to floor  but needs UE assist to rise 2 min walk test to be done next visit  GAIT: Comments: decreased stride length   TODAY'S TREATMENT:                                                                                                                               DATE: 8/16  Discussed  sit to stand (8 reps) at a time to build strength in quad muscles   PATIENT EDUCATION:  Education details: Educated patient on anatomy and physiology of current symptoms, prognosis, plan of care as well as initial self care strategies to promote recovery Person educated: Patient Education method: Explanation Education comprehension: verbalized understanding  HOME EXERCISE PROGRAM: To be started  ASSESSMENT:  CLINICAL IMPRESSION: Patient is a 77 y.o. female who was seen today for physical therapy evaluation and treatment for left hip pain. The patient demonstrates decreased hip external rotation range of motion and decreased hip flexor muscle length.  Strength deficits in bil gluteals particularly left glute medius muscle with obvious pelvic drop noted in single leg standing.  Tender points noted in gluteals ITB and on the greater trochanteric hip bursa.  These deficits and pain cause functional impairments with standing to cook, walking to keep up with her 57 year old granddaughter, picking up toys off the floor and avoidance of the steps to go upstairs in her house.  She would benefit from skilled interventions to address these deficits.       OBJECTIVE IMPAIRMENTS: decreased activity tolerance, decreased mobility, difficulty walking, decreased ROM, decreased strength, increased fascial restrictions, and pain.   ACTIVITY LIMITATIONS: carrying, lifting, bending, sitting, standing, squatting, sleeping, stairs, and locomotion level  PARTICIPATION LIMITATIONS: meal prep, cleaning, laundry, shopping, and community activity  PERSONAL FACTORS: Time since onset of injury/illness/exacerbation and 1 comorbidity: history of knee and foot issues  are also affecting patient's functional outcome.   REHAB POTENTIAL: Good  CLINICAL DECISION MAKING: Stable/uncomplicated  EVALUATION COMPLEXITY: Low   GOALS: Goals reviewed with patient? Yes  SHORT TERM GOALS: Target  date: 11/18/2022   The patient will demonstrate knowledge of basic self care strategies and exercises to promote healing  Baseline: Goal status: INITIAL  2.  The patient will report a 35% improvement in pain levels with functional activities which are currently difficult including  standing to cook, sleeping, taking her grandchildren to trampoline park Baseline:  Goal status: INITIAL  3.  The patient will be able to walk 5-10 minutes with pain level 4/10 Baseline:  Goal status: INITIAL  4.  Improved LE strength for squatting to pick up objects from the floor without pushing up on her thigh Baseline:  Goal status: INITIAL    LONG TERM GOALS: Target date: 12/16/2022  The patient will be independent in a safe self progression of a home exercise program to promote further recovery of function  Baseline:  Goal status: INITIAL  2.  The patient will report a 65% improvement in pain levels with functional activities which are currently difficult including walking  and standing to cook Baseline:  Goal status: INITIAL  3.  The patient will have improved knee and hip strength to at least 4+/5 needed for standing to cook, walking longer distances with her grandchildren Baseline:  Goal status: INITIAL  4.  The patient will be able to walk 20 minutes for a walking program for health Baseline:  Goal status: INITIAL  5.  The patient will have improved FOTO score to    55%   indicating improved function with less pain Baseline:  Goal status: INITIAL  6. Patient will be able to ascend and descend the steps to go upstairs in her home Initial PLAN:  PT FREQUENCY: 2x/week  PT DURATION: 8 weeks  PLANNED INTERVENTIONS: Therapeutic exercises, Therapeutic activity, Neuromuscular re-education, Balance training, Gait training, Patient/Family education, Self Care, Joint mobilization, Aquatic Therapy, Dry Needling, Electrical stimulation, Cryotherapy, Moist heat, Taping, Ultrasound,  Ionotophoresis 4mg /ml Dexamethasone, Manual therapy, and Re-evaluation  PLAN FOR NEXT SESSION: check timed walk test;  start HEP: sit to stand no hands, step ups, hip flexor stretch on steps and also in 1/2 kneel position;  leg swings off step;  standing clams; bent rows; DN as needed to left gluteals and piriformis  Adrienne Luna, PT 10/21/22 2:00 PM Phone: [phone] Fax: [phone]

## 2022-10-31 ENCOUNTER — Other Ambulatory Visit: Payer: Self-pay | Admitting: Internal Medicine

## 2022-10-31 DIAGNOSIS — I77819 Aortic ectasia, unspecified site: Secondary | ICD-10-CM

## 2022-11-02 ENCOUNTER — Other Ambulatory Visit: Payer: Self-pay | Admitting: Nurse Practitioner

## 2022-11-02 DIAGNOSIS — R109 Unspecified abdominal pain: Secondary | ICD-10-CM

## 2022-11-08 ENCOUNTER — Ambulatory Visit
Admission: RE | Admit: 2022-11-08 | Discharge: 2022-11-08 | Disposition: A | Discharge status: Home / Self Care | Payer: Medicare Other | Source: Ambulatory Visit | Attending: Nurse Practitioner | Admitting: Nurse Practitioner

## 2022-11-08 ENCOUNTER — Ambulatory Visit
Admission: RE | Admit: 2022-11-08 | Discharge: 2022-11-08 | Disposition: A | Discharge status: Home / Self Care | Payer: Medicare Other | Source: Ambulatory Visit | Attending: Internal Medicine | Admitting: Internal Medicine

## 2022-11-08 DIAGNOSIS — R109 Unspecified abdominal pain: Secondary | ICD-10-CM

## 2022-11-08 DIAGNOSIS — I77819 Aortic ectasia, unspecified site: Secondary | ICD-10-CM

## 2022-11-08 MED ORDER — IOPAMIDOL (ISOVUE-370) INJECTION 76%
75.0000 mL | Freq: Once | INTRAVENOUS | Status: AC | PRN
  Administered 2022-11-08: 75 mL via INTRAVENOUS

## 2022-11-16 ENCOUNTER — Ambulatory Visit: Payer: Medicare Other | Attending: Internal Medicine | Admitting: Physical Therapy

## 2022-11-16 DIAGNOSIS — M25552 Pain in left hip: Secondary | ICD-10-CM | POA: Diagnosis present

## 2022-11-16 DIAGNOSIS — M6281 Muscle weakness (generalized): Secondary | ICD-10-CM

## 2022-11-16 DIAGNOSIS — R262 Difficulty in walking, not elsewhere classified: Secondary | ICD-10-CM

## 2022-11-16 NOTE — Therapy (Signed)
OUTPATIENT PHYSICAL THERAPY LOWER EXTREMITY PROGRESS NOTE   Patient Name: Adrienne Luna MRN: [id number] DOB:[birth year], 77 y.o., female Today's Date: 11/16/2022  END OF SESSION:  PT End of Session - 11/16/22 0936     Visit Number 2    Date for PT Re-Evaluation 12/16/22    Authorization Type medicare A/B    Progress Note Due on Visit 10    PT Start Time 0932    PT Stop Time 1015    PT Time Calculation (min) 43 min    Activity Tolerance Patient tolerated treatment well             Past Medical History:  Diagnosis Date   IBS (irritable bowel syndrome)    Past Surgical History:  Procedure Laterality Date   ABDOMINAL HYSTERECTOMY     CESAREAN SECTION     x2   Patient Active Problem List   Diagnosis Date Noted   Diverticulitis of intestine, part unspecified, without perforation or abscess without bleeding 04/28/2020   Unspecified menopausal and perimenopausal disorder 12/14/2016   Pain in right shoulder 12/14/2016   Other specified disorders of bone density and structure, unspecified site 12/14/2016   Insomnia, unspecified 12/14/2016   Hyperlipidemia, unspecified 12/14/2016   History of colonic polyps 12/14/2016   Gastro-esophageal reflux disease without esophagitis 12/14/2016   Essential (primary) hypertension 12/14/2016   Vitamin D deficiency, unspecified 12/14/2016   Chronic migraine without aura without status migrainosus, not intractable 12/16/2014   Aneurysm of superficial temporal artery (HCC) 12/15/2014    PCP: Hillard Danker MD  REFERRING PROVIDER:  Hillard Danker MD  REFERRING DIAG: M25.552 pain in left hip  THERAPY DIAG:  Left hip pain; weakness  Rationale for Evaluation and Treatment: Rehabilitation  ONSET DATE: Jan 2024  SUBJECTIVE:   SUBJECTIVE STATEMENT: My hip is so bad.  I don't feel like I can do the machines until we DN that area.  I've had a CT scan and an [place], see the cardio next week.  Still having low energy. My chest hurts not related  to activity, still feel at rest.  PERTINENT HISTORY: Recent right carpal tunnel surgery 1 month ago Occipital neuralgia; cervical stenosis (nerve ablation) GERD (Nexium);  preventive med for BP;  aspirin Surgery in August colonectomy had a blood clot in leg Vit D3 Night take Prevastatin Husband has medical issues and she's been caregiver 4 foot surgeries on right foot left meniscus tear 2022 had injections  PAIN:   Are you having pain? Yes NPRS scale: 5/10 Pain location: left posterior hip  Aggravating factors: avoids stairs; not walking much b/c of decreased energy more than pain; sleeping on my left side; standing to cook a meal takes 2-3 hours Relieving factors: unsure  PRECAUTIONS: None   WEIGHT BEARING RESTRICTIONS: No  FALLS:  Has patient fallen in last 6 months? No  LIVING ENVIRONMENT: Lives with: lives with their spouse Lives in: House/apartment    OCCUPATION: retired  PLOF: Independent  PATIENT GOALS: I want to walk 20-30 min (1-2 miles); play for 1 hour with grandchildren;  take grandchildren to [employer] jumping park  NEXT MD VISIT: not scheduled  OBJECTIVE:    PATIENT SURVEYS:  FOTO 38%  COGNITION: Overall cognitive status: Within functional limits for tasks assessed     PALPATION: Tender points in left gluteals, left ITB and left greater trochanteric bursa  LOWER EXTREMITY ROM: grossly WFLs except decreased left hip external rotation 35 degrees vs. 45 on right;  decreased left hip flexor length  LOWER  EXTREMITY MMT:  MMT Right eval Left eval  Hip flexion 5 4  Hip extension 5 4  Hip abduction 4- 4-  Hip adduction    Hip internal rotation    Hip external rotation 4 4  Knee flexion    Knee extension 5 4  Ankle dorsiflexion    Ankle plantarflexion    Ankle inversion    Ankle eversion     (Blank rows = not tested)  LOWER EXTREMITY SPECIAL TESTS:  SLS:  5 sec on right with moderate lateral trunk lean;  5 sec on left with pelvic  drop  FUNCTIONAL TESTS:  5x sit to stand 18.51 no UE use required To pick up small item from floor uses hand on thigh to push up to rise Able to transfer to floor  but needs UE assist to rise 2 min walk test to be done next visit  GAIT: Comments: decreased stride length   TODAY'S TREATMENT:   DATE: 9/11: Doorway ITB stretch per HEP 1/2 kneel ITB/TFL stretch per HEP Sit to stand without Ues per HEP Manual therapy: soft tissue mobilization to gluteals and ITB in sidelying Trigger Point Dry-Needling  Treatment instructions: Expect mild to moderate muscle soreness. S/S of pneumothorax if dry needled over a lung field, and to seek immediate medical attention should they occur. Patient verbalized understanding of these instructions and education.  Patient Consent Given: Yes Education handout provided: Yes Muscles treated: sidelying left gluteals, piriformis, ITB Electrical stimulation performed: No Parameters: N/A Treatment response/outcome: improved soft tissue mobility and dec tender poit s                                                                                                                                 DATE: 8/16  Discussed  sit to stand (8 reps) at a time to build strength in quad muscles   PATIENT EDUCATION:  Education details: Educated patient on anatomy and physiology of current symptoms, prognosis, plan of care as well as initial self care strategies to promote recovery Person educated: Patient Education method: Explanation Education comprehension: verbalized understanding  HOME EXERCISE PROGRAM: Access Code: [facility] URL: [email or web address] Date: 11/16/2022 Prepared by: Lavinia Sharps  Exercises - Standing ITB Stretch  - 1 x daily - 7 x weekly - 1 sets - 3 reps - 30 hold - Half Kneeling ITB/TFL Stretch  - 1 x daily - 7 x weekly - 1 sets - 3 reps - 30 hold - Sit to Stand  - 1 x daily - 7 x weekly - 1 sets - 10  reps  ASSESSMENT:  CLINICAL IMPRESSION: The patient benefits significantly from dry needling and manual therapy to stimulate underlying myofascial trigger points and muscular tissue for management of neuromusculoskeletal pain and address movement impairments.  Much improved soft tissue mobility and decreased tender point size and number following treatment session.  The patient was encouraged in regular performance of HEP post DN including soft  tissue lengthening and strengthening exercises to enhance long term benefit.      OBJECTIVE IMPAIRMENTS: decreased activity tolerance, decreased mobility, difficulty walking, decreased ROM, decreased strength, increased fascial restrictions, and pain.   ACTIVITY LIMITATIONS: carrying, lifting, bending, sitting, standing, squatting, sleeping, stairs, and locomotion level  PARTICIPATION LIMITATIONS: meal prep, cleaning, laundry, shopping, and community activity  PERSONAL FACTORS: Time since onset of injury/illness/exacerbation and 1 comorbidity: history of knee and foot issues  are also affecting patient's functional outcome.   REHAB POTENTIAL: Good  CLINICAL DECISION MAKING: Stable/uncomplicated  EVALUATION COMPLEXITY: Low   GOALS: Goals reviewed with patient? Yes  SHORT TERM GOALS: Target date: 11/18/2022   The patient will demonstrate knowledge of basic self care strategies and exercises to promote healing  Baseline: Goal status: INITIAL  2.  The patient will report a 35% improvement in pain levels with functional activities which are currently difficult including  standing to cook, sleeping, taking her grandchildren to trampoline park Baseline:  Goal status: INITIAL  3.  The patient will be able to walk 5-10 minutes with pain level 4/10 Baseline:  Goal status: INITIAL  4.  Improved LE strength for squatting to pick up objects from the floor without pushing up on her thigh Baseline:  Goal status: INITIAL    LONG TERM GOALS:  Target date: 12/16/2022  The patient will be independent in a safe self progression of a home exercise program to promote further recovery of function  Baseline:  Goal status: INITIAL  2.  The patient will report a 65% improvement in pain levels with functional activities which are currently difficult including walking  and standing to cook Baseline:  Goal status: INITIAL  3.  The patient will have improved knee and hip strength to at least 4+/5 needed for standing to cook, walking longer distances with her grandchildren Baseline:  Goal status: INITIAL  4.  The patient will be able to walk 20 minutes for a walking program for health Baseline:  Goal status: INITIAL  5.  The patient will have improved FOTO score to    55%   indicating improved function with less pain Baseline:  Goal status: INITIAL  6. Patient will be able to ascend and descend the steps to go upstairs in her home Initial PLAN:  PT FREQUENCY: 2x/week  PT DURATION: 8 weeks  PLANNED INTERVENTIONS: Therapeutic exercises, Therapeutic activity, Neuromuscular re-education, Balance training, Gait training, Patient/Family education, Self Care, Joint mobilization, Aquatic Therapy, Dry Needling, Electrical stimulation, Cryotherapy, Moist heat, Taping, Ultrasound, Ionotophoresis 4mg /ml Dexamethasone, Manual therapy, and Re-evaluation  PLAN FOR NEXT SESSION: sit to stand no hands, step ups, hip flexor stretch on steps and also in 1/2 kneel position;  leg swings off step;  standing clams;  DN  left gluteals and piriformis may add ES; sees cardiologist next week  Lavinia Sharps, PT 11/16/22 9:10 PM Phone: [phone] Fax: [phone]

## 2022-11-17 ENCOUNTER — Ambulatory Visit: Payer: 59 | Admitting: Cardiology

## 2022-11-17 ENCOUNTER — Ambulatory Visit: Payer: Medicare Other | Admitting: Physical Therapy

## 2022-11-21 ENCOUNTER — Other Ambulatory Visit: Payer: Medicare Other

## 2022-11-22 ENCOUNTER — Ambulatory Visit: Payer: Medicare Other | Admitting: Physical Therapy

## 2022-11-22 DIAGNOSIS — M25552 Pain in left hip: Secondary | ICD-10-CM

## 2022-11-22 DIAGNOSIS — M6281 Muscle weakness (generalized): Secondary | ICD-10-CM

## 2022-11-22 DIAGNOSIS — R262 Difficulty in walking, not elsewhere classified: Secondary | ICD-10-CM

## 2022-11-22 NOTE — Therapy (Signed)
OUTPATIENT PHYSICAL THERAPY LOWER EXTREMITY PROGRESS NOTE   Patient Name: Adrienne Luna MRN: [id number] DOB:Jul 29, 1945, 77 y.o., female Today's Date: 11/22/2022  END OF SESSION:  PT End of Session - 11/22/22 1144     Visit Number 3    Date for PT Re-Evaluation 12/16/22    Authorization Type medicare A/B    Progress Note Due on Visit 10    PT Start Time 1145    PT Stop Time 1225    PT Time Calculation (min) 40 min    Activity Tolerance Patient tolerated treatment well             Past Medical History:  Diagnosis Date   IBS (irritable bowel syndrome)    Past Surgical History:  Procedure Laterality Date   ABDOMINAL HYSTERECTOMY     CESAREAN SECTION     x2   Patient Active Problem List   Diagnosis Date Noted   Diverticulitis of intestine, part unspecified, without perforation or abscess without bleeding 04/28/2020   Unspecified menopausal and perimenopausal disorder 12/14/2016   Pain in right shoulder 12/14/2016   Other specified disorders of bone density and structure, unspecified site 12/14/2016   Insomnia, unspecified 12/14/2016   Hyperlipidemia, unspecified 12/14/2016   History of colonic polyps 12/14/2016   Gastro-esophageal reflux disease without esophagitis 12/14/2016   Essential (primary) hypertension 12/14/2016   Vitamin D deficiency, unspecified 12/14/2016   Chronic migraine without aura without status migrainosus, not intractable 12/16/2014   Aneurysm of superficial temporal artery (HCC) 12/15/2014    PCP: Hillard Danker MD  REFERRING PROVIDER:  Hillard Danker MD  REFERRING DIAG: M25.552 pain in left hip  THERAPY DIAG:  Left hip pain; weakness  Rationale for Evaluation and Treatment: Rehabilitation  ONSET DATE: Jan 2024  SUBJECTIVE:   SUBJECTIVE STATEMENT: I was sore as a boil after DN last session.  I felt like it helped my ITB but the top of the left buttock still hurts.  I can hardly sit.     PERTINENT HISTORY: Recent right carpal tunnel  surgery 1 month ago Occipital neuralgia; cervical stenosis (nerve ablation) GERD (Nexium);  preventive med for BP;  aspirin Surgery in August colonectomy had a blood clot in leg Vit D3 Night take Prevastatin Husband has medical issues and she's been caregiver 4 foot surgeries on right foot left meniscus tear 2022 had injections  PAIN:   Are you having pain? Yes NPRS scale: 5/10 Pain location: left upper posterior hip  Aggravating factors: avoids stairs; not walking much b/c of decreased energy more than pain; sleeping on my left side; standing to cook a meal takes 2-3 hours Relieving factors: unsure  PRECAUTIONS: None   WEIGHT BEARING RESTRICTIONS: No  FALLS:  Has patient fallen in last 6 months? No  LIVING ENVIRONMENT: Lives with: lives with their spouse Lives in: House/apartment    OCCUPATION: retired  PLOF: Independent  PATIENT GOALS: I want to walk 20-30 min (1-2 miles); play for 1 hour with grandchildren;  take grandchildren to [employer] jumping park  NEXT MD VISIT: not scheduled  OBJECTIVE:    PATIENT SURVEYS:  FOTO 38%  COGNITION: Overall cognitive status: Within functional limits for tasks assessed     PALPATION: Tender points in left gluteals, left ITB and left greater trochanteric bursa  LOWER EXTREMITY ROM: grossly WFLs except decreased left hip external rotation 35 degrees vs. 45 on right;  decreased left hip flexor length  LOWER EXTREMITY MMT:  MMT Right eval Left eval  Hip flexion 5 4  Hip extension 5 4  Hip abduction 4- 4-  Hip adduction    Hip internal rotation    Hip external rotation 4 4  Knee flexion    Knee extension 5 4  Ankle dorsiflexion    Ankle plantarflexion    Ankle inversion    Ankle eversion     (Blank rows = not tested)  LOWER EXTREMITY SPECIAL TESTS:  SLS:  5 sec on right with moderate lateral trunk lean;  5 sec on left with pelvic drop  FUNCTIONAL TESTS:  5x sit to stand 18.51 no UE use required To  pick up small item from floor uses hand on thigh to push up to rise Able to transfer to floor  but needs UE assist to rise 2 min walk test to be done next visit  GAIT: Comments: decreased stride length   TODAY'S TREATMENT:   DATE: 9/17: Doorway ITB stretch with UE reachover 2 sets of 5 Standing leaning on table: fire hydrant 10x right/left (hurts hands, suggested lean on elbows) Sit to stand 10x without Ues; sit to stand holding dumbbell 8# 10x  Dead lift single dumbbell 8# to 5 inch target 10x with cue for forward gaze Manual therapy: soft tissue mobilization to gluteals and ITB in sidelying Trigger Point Dry-Needling  Treatment instructions: Expect mild to moderate muscle soreness. S/S of pneumothorax if dry needled over a lung field, and to seek immediate medical attention should they occur. Patient verbalized understanding of these instructions and education.  Patient Consent Given: Yes Education handout provided: Yes Muscles treated: sidelying left gluteals, piriformis Electrical stimulation performed:yes  Parameters: 1.5 ma 10 min Treatment response/outcome: improved soft tissue mobility and dec tender points   DATE: 9/11: Doorway ITB stretch per HEP 1/2 kneel ITB/TFL stretch per HEP Sit to stand without Ues per HEP Manual therapy: soft tissue mobilization to gluteals and ITB in sidelying Trigger Point Dry-Needling  Treatment instructions: Expect mild to moderate muscle soreness. S/S of pneumothorax if dry needled over a lung field, and to seek immediate medical attention should they occur. Patient verbalized understanding of these instructions and education.  Patient Consent Given: Yes Education handout provided: Yes Muscles treated: sidelying left gluteals, piriformis, ITB Electrical stimulation performed: No Parameters: N/A Treatment response/outcome: improved soft tissue mobility and dec tender poit s                                                                                                                                  DATE: 8/16  Discussed  sit to stand (8 reps) at a time to build strength in quad muscles   PATIENT EDUCATION:  Education details: Educated patient on anatomy and physiology of current symptoms, prognosis, plan of care as well as initial self care strategies to promote recovery Person educated: Patient Education method: Explanation Education comprehension: verbalized understanding  HOME EXERCISE PROGRAM: Access Code: [facility] URL: [email or web address] Date: 11/16/2022 Prepared by: Lavinia Sharps  Exercises -  Standing ITB Stretch  - 1 x daily - 7 x weekly - 1 sets - 3 reps - 30 hold - Half Kneeling ITB/TFL Stretch  - 1 x daily - 7 x weekly - 1 sets - 3 reps - 30 hold - Sit to Stand  - 1 x daily - 7 x weekly - 1 sets - 10 reps  ASSESSMENT:  CLINICAL IMPRESSION: Able to progress ex's to target strengthening of gluteals and quads.  The patient reports extreme soreness post DN last visit so added ES to DN for pain modulation.  She responds positively to all immediately following session.  Partial STGs met but limited visits since start of care.  Therapist monitoring response to all interventions and modifying treatment accordingly.        OBJECTIVE IMPAIRMENTS: decreased activity tolerance, decreased mobility, difficulty walking, decreased ROM, decreased strength, increased fascial restrictions, and pain.   ACTIVITY LIMITATIONS: carrying, lifting, bending, sitting, standing, squatting, sleeping, stairs, and locomotion level  PARTICIPATION LIMITATIONS: meal prep, cleaning, laundry, shopping, and community activity  PERSONAL FACTORS: Time since onset of injury/illness/exacerbation and 1 comorbidity: history of knee and foot issues  are also affecting patient's functional outcome.   REHAB POTENTIAL: Good  CLINICAL DECISION MAKING: Stable/uncomplicated  EVALUATION COMPLEXITY: Low   GOALS: Goals reviewed  with patient? Yes  SHORT TERM GOALS: Target date: 11/18/2022   The patient will demonstrate knowledge of basic self care strategies and exercises to promote healing  Baseline: Goal status: met 9/17  2.  The patient will report a 35% improvement in pain levels with functional activities which are currently difficult including  standing to cook, sleeping, taking her grandchildren to trampoline park Baseline:  Goal status: INITIAL  3.  The patient will be able to walk 5-10 minutes with pain level 4/10 Baseline:  Goal status: INITIAL  4.  Improved LE strength for squatting to pick up objects from the floor without pushing up on her thigh Baseline:  Goal status: met 9/13    LONG TERM GOALS: Target date: 12/16/2022  The patient will be independent in a safe self progression of a home exercise program to promote further recovery of function  Baseline:  Goal status: INITIAL  2.  The patient will report a 65% improvement in pain levels with functional activities which are currently difficult including walking  and standing to cook Baseline:  Goal status: INITIAL  3.  The patient will have improved knee and hip strength to at least 4+/5 needed for standing to cook, walking longer distances with her grandchildren Baseline:  Goal status: INITIAL  4.  The patient will be able to walk 20 minutes for a walking program for health Baseline:  Goal status: INITIAL  5.  The patient will have improved FOTO score to    55%   indicating improved function with less pain Baseline:  Goal status: INITIAL  6. Patient will be able to ascend and descend the steps to go upstairs in her home Initial PLAN:  PT FREQUENCY: 2x/week  PT DURATION: 8 weeks  PLANNED INTERVENTIONS: Therapeutic exercises, Therapeutic activity, Neuromuscular re-education, Balance training, Gait training, Patient/Family education, Self Care, Joint mobilization, Aquatic Therapy, Dry Needling, Electrical stimulation,  Cryotherapy, Moist heat, Taping, Ultrasound, Ionotophoresis 4mg /ml Dexamethasone, Manual therapy, and Re-evaluation  PLAN FOR NEXT SESSION: sit to stand no hands, step ups,  dead lifts; leg swings off step;  standing clams;  DN  left gluteals and piriformis with ES; sees cardiologist tomorrow  Lavinia Sharps, PT 11/22/22 8:47  PM Phone: [phone] Fax: [phone]

## 2022-11-23 ENCOUNTER — Ambulatory Visit: Payer: 59 | Admitting: Cardiology

## 2022-11-23 ENCOUNTER — Encounter: Payer: Self-pay | Admitting: Cardiology

## 2022-11-23 VITALS — BP 123/82 | HR 58 | Resp 16 | Ht 63.5 in | Wt 177.2 lb

## 2022-11-23 DIAGNOSIS — R0602 Shortness of breath: Secondary | ICD-10-CM

## 2022-11-23 DIAGNOSIS — R072 Precordial pain: Secondary | ICD-10-CM

## 2022-11-23 DIAGNOSIS — E7800 Pure hypercholesterolemia, unspecified: Secondary | ICD-10-CM

## 2022-11-23 NOTE — Progress Notes (Signed)
Primary Physician/Referring:  Thana Ates, MD  Patient ID: Adrienne Luna, female    DOB: 1945-05-01, 77 y.o.   MRN: [id number]  Chief Complaint  Patient presents with   Eval for SOB   New Patient (Initial Visit)   HPI:    Adrienne Luna  is a 77 y.o. with very mild carotid arthrosclerosis dating back to 2019, well-controlled hypercholesterolemia referred to me for evaluation of shortness of breath and chest pain.  Patient states that over the past year and a half [place], [place] she reduce her physical activity as it was winter and as she had recently moved into a new place was busy with household chores.  And then eventually when she started to and try to walk around she has noticed marked dyspnea.  She also hears wheezing occasionally.  No PND or orthopnea.  She also complains of chest pain, described as tightness to sharp pain in the middle of the chest and eventually radiates to the back.  The episode can last several hours or 7 minutes and there is no exacerbating or relieving factors.  Sometimes sitting down and taking some deep breaths may help to relieve the pain.  This has been ongoing for the past 8 to 9 months.  She was also evaluated in the emergency room for the same recently.  Past Medical History:  Diagnosis Date   IBS (irritable bowel syndrome)    Past Surgical History:  Procedure Laterality Date   ABDOMINAL HYSTERECTOMY     CESAREAN SECTION     x2   History reviewed. No pertinent family history.  Social History   Tobacco Use   Smoking status: Never   Smokeless tobacco: Never  Substance Use Topics   Alcohol use: Not on file   Marital Status: Married  ROS  Review of Systems  Cardiovascular:  Positive for chest pain and dyspnea on exertion. Negative for leg swelling.   Objective      11/23/2022   11:40 AM 10/18/2022    9:00 PM 10/18/2022    8:30 PM  Vitals with BMI  Height 5' 3.5"    Weight 177  lbs 3 oz    BMI 30.89    Systolic 123 140 782  Diastolic 82 81 82  Pulse 58 58 58   Blood pressure 123/82, pulse (!) 58, resp. rate 16, height 5' 3.5" (1.613 m), weight 177 lb 3.2 oz (80.4 kg), SpO2 95%.  Physical Exam Neck:     Vascular: No carotid bruit or JVD.  Cardiovascular:     Rate and Rhythm: Normal rate and regular rhythm.     Pulses: Intact distal pulses.     Heart sounds: Normal heart sounds. No murmur heard.    No gallop.  Pulmonary:     Effort: Pulmonary effort is normal.     Breath sounds: Normal breath sounds.  Abdominal:     General: Bowel sounds are normal.     Palpations: Abdomen is soft.  Musculoskeletal:     Right lower leg: No edema.     Left lower leg: No edema.    Laboratory examination:   Recent Labs    10/18/22 1706  NA 141  K 4.0  CL 106  CO2 25  GLUCOSE 107*  BUN 15  CREATININE 0.89  CALCIUM 9.5  GFRNONAA >60    Lab Results  Component Value Date   GLUCOSE 107 (H) 10/18/2022   NA 141  10/18/2022   K 4.0 10/18/2022   CL 106 10/18/2022   CO2 25 10/18/2022   BUN 15 10/18/2022   CREATININE 0.89 10/18/2022   GFRNONAA >60 10/18/2022   CALCIUM 9.5 10/18/2022   ANIONGAP 10 10/18/2022      No results found for: "ALT", "AST", "GGT", "ALKPHOS", "BILITOT"     Latest Ref Rng & Units 10/18/2022    5:06 PM  CBC  WBC 4.0 - 10.5 K/uL 7.3   Hemoglobin 12.0 - 15.0 g/dL 11.9   Hematocrit 14.7 - 46.0 % 40.0   Platelets 150 - 400 K/uL 241    External labs:   Labs 11/10/2022:  Vitamin D 32.  TSH normal at 0.64.  Hb 13.1/HCT 40.8, platelets 264.  Serum glucose 89 mg, BUN 14, creatinine 0.96, EGFR 61 mL, potassium 4.1, LFTs normal.  Total cholesterol 178, triglycerides 116, HDL 94, LDL 64.  Radiology:   CT angio chest 02/01/2021: No findings of thoracic aortic aneurysm or dissection.  Small hiatal hernia. CTA chest 11/02/2020: No pulmonary emboli or other acute thoracic abnormality. CTA abdomen 08/24/2020: Acute sigmoid diverticulitis,  no other significant findings.  CT angio chest 11/08/2022: Small hiatal hernia, No acute thoracic aortic abnormality, Mild atherosclerotic plaque in the thoracic aorta.  No coronary artery calcification.  Normal pulmonary artery size.  No PE. Cardiac Studies:   Carotid artery duplex 03/21/2020: There is a mild amount of plaque throughout both carotid systems but there is no hemodynamically sign  ificant stenosis or ulceration on either side. Antegrade flow is noted in both vertebral arteries. In  cidentally the left temporal artery was examined and there appears to be a small aneurysm in the mid  segment. Vertebral   Antegrade Antegrade      EKG:   EKG 11/23/2022, normal sinus rhythm at rate of 57 bpm, normal EKG.    Medications and allergies   Allergies  Allergen Reactions   Codeine Nausea And Vomiting   Demerol [Meperidine Hcl] Nausea And Vomiting    Medication list   Current Outpatient Medications:    aspirin 81 MG chewable tablet, Chew 81 mg by mouth daily., Disp: , Rfl:    esomeprazole (NEXIUM) 10 MG packet, Take 10 mg by mouth daily before breakfast., Disp: , Rfl:    polyethylene glycol (MIRALAX / GLYCOLAX) 17 g packet, Take 17 g by mouth daily., Disp: , Rfl:    rosuvastatin (CRESTOR) 10 MG tablet, Take 10 mg by mouth daily., Disp: , Rfl:   Assessment     ICD-10-CM   1. Shortness of breath  R06.02 EKG 12-Lead    ECHOCARDIOGRAM COMPLETE    Exercise Tolerance Test    Ambulatory referral to Pulmonology    2. Precordial pain  R07.2 ECHOCARDIOGRAM COMPLETE    Exercise Tolerance Test    3. Hypercholesteremia  E78.00        Orders Placed This Encounter  Procedures   Ambulatory referral to Pulmonology    Referral Priority:   Routine    Referral Type:   Consultation    Referral Reason:   Specialty Services Required    Requested Specialty:   Pulmonary Disease    Number of Visits Requested:   1   Exercise Tolerance Test    Standing Status:   Future    Standing  Expiration Date:   11/23/2023    Order Specific Question:   Where should this test be performed    Answer:   [facility] [facility]    Order Specific Question:  Stress with pharmacologic or treadmill ?    Answer:   Treadmill w/ exercise    Order Specific Question:   Is patient able to ambulate on a treadmill?    Answer:   Yes    Order Specific Question:   Release to patient    Answer:   Immediate   EKG 12-Lead   ECHOCARDIOGRAM COMPLETE    Standing Status:   Future    Standing Expiration Date:   11/23/2023    Order Specific Question:   Where should this test be performed    Answer:   [facility])    Order Specific Question:   Does the patient weigh less than or greater than 250 lbs?    Answer:   Patient weighs less than 250 lbs    Order Specific Question:   Perflutren DEFINITY (image enhancing agent) should be administered unless hypersensitivity or allergy exist    Answer:   Administer Perflutren    Order Specific Question:   Reason for exam-Echo    Answer:   Dyspnea  R06.00    No orders of the defined types were placed in this encounter.   Medications Discontinued During This Encounter  Medication Reason   linaCLOtide (LINZESS PO)    methocarbamol (ROBAXIN) 500 MG tablet    meloxicam (MOBIC) 7.5 MG tablet      Recommendations:   Adrienne Luna is a 77 y.o. with very mild carotid arthrosclerosis dating back to 2019, well-controlled hypercholesterolemia referred to me for evaluation of shortness of breath and chest pain.  1. Shortness of breath I am very concerned about her shortness of breath.  She used to be active a year and a half ago before moving from [place] to [place] and now states that even walking half a block she had to stop.  She also has occasional wheezing.  I am beginning to wonder whether she has developed reactive airway disease and may need pulmonary evaluation.  - EKG 12-Lead - ECHOCARDIOGRAM COMPLETE; Future - Exercise  Tolerance Test; Future - Ambulatory referral to Pulmonology  2. Precordial pain Her chest pain symptoms do not suggest angina pectoris.  Most of the symptoms are occurring at rest and can last several hours to several minutes without any other associated symptoms, sometimes she feels pain in the back.  She has had multiple extensive CT scans of the chest and abdomen in the past, none from reveal any coronary calcification.  She is also on a statin.  She is not a diabetic, non-smoker and does not have hypertension or family history.  Hence my suspicion for chest pain to be cardiac is very low. I will schedule her for an echocardiogram and a routine treadmill exercise stress test, unless abnormal I will see her back on a as needed basis.  - ECHOCARDIOGRAM COMPLETE; Future - Exercise Tolerance Test; Future  3. Hypercholesteremia I reviewed her external labs, lipids under good control.  Physical examination and EKG are normal.  I also reviewed her evaluation that were done in [place] on [facility]where and documented them.  Other orders - aspirin 81 MG chewable tablet; Chew 81 mg by mouth daily.     Adrienne Decamp, MD, [facility] 11/23/2022, 12:38 PM Office: [phone]

## 2022-11-24 ENCOUNTER — Encounter: Payer: Medicare Other | Admitting: Physical Therapy

## 2022-12-01 ENCOUNTER — Ambulatory Visit: Payer: Medicare Other | Admitting: Physical Therapy

## 2022-12-01 DIAGNOSIS — M25552 Pain in left hip: Secondary | ICD-10-CM

## 2022-12-01 DIAGNOSIS — M6281 Muscle weakness (generalized): Secondary | ICD-10-CM

## 2022-12-01 DIAGNOSIS — R262 Difficulty in walking, not elsewhere classified: Secondary | ICD-10-CM

## 2022-12-01 NOTE — Therapy (Signed)
OUTPATIENT PHYSICAL THERAPY LOWER EXTREMITY PROGRESS NOTE   Patient Name: Adrienne Luna MRN: [id number] DOB:05-Apr-1945, 77 y.o., female Today's Date: 12/01/2022  END OF SESSION:  PT End of Session - 12/01/22 1148     Visit Number 4    Date for PT Re-Evaluation 12/16/22    Authorization Type medicare A/B    Progress Note Due on Visit 10    PT Start Time 1146    PT Stop Time 1228    PT Time Calculation (min) 42 min    Activity Tolerance Patient tolerated treatment well             Past Medical History:  Diagnosis Date   IBS (irritable bowel syndrome)    Past Surgical History:  Procedure Laterality Date   ABDOMINAL HYSTERECTOMY     CESAREAN SECTION     x2   Patient Active Problem List   Diagnosis Date Noted   Diverticulitis of intestine, part unspecified, without perforation or abscess without bleeding 04/28/2020   Unspecified menopausal and perimenopausal disorder 12/14/2016   Pain in right shoulder 12/14/2016   Other specified disorders of bone density and structure, unspecified site 12/14/2016   Insomnia, unspecified 12/14/2016   Hyperlipidemia, unspecified 12/14/2016   History of colonic polyps 12/14/2016   Gastro-esophageal reflux disease without esophagitis 12/14/2016   Essential (primary) hypertension 12/14/2016   Vitamin D deficiency, unspecified 12/14/2016   Chronic migraine without aura without status migrainosus, not intractable 12/16/2014   Aneurysm of superficial temporal artery (HCC) 12/15/2014    PCP: Hillard Danker MD  REFERRING PROVIDER:  Hillard Danker MD  REFERRING DIAG: M25.552 pain in left hip  THERAPY DIAG:  Left hip pain; weakness  Rationale for Evaluation and Treatment: Rehabilitation  ONSET DATE: Jan 2024  SUBJECTIVE:   SUBJECTIVE STATEMENT: I'm sore in my quads.  The DN helped bu now it's in a different spot left lumbosacral region.  I couldn't sleep good.  My ITBs are much better.  Did 10,000 steps at [facility].   Cardiologist doesn't think it's my heart but going to do an U/S and stress test on the 9th.    PERTINENT HISTORY: Recent right carpal tunnel surgery 1 month ago Occipital neuralgia; cervical stenosis (nerve ablation) GERD (Nexium);  preventive med for BP;  aspirin Surgery in August colonectomy had a blood clot in leg Vit D3 Night take Prevastatin Husband has medical issues and she's been caregiver 4 foot surgeries on right foot left meniscus tear 2022 had injections  PAIN:   Are you having pain? Yes NPRS scale: 10/10 Pain location: left upper posterior hip  Aggravating factors: avoids stairs; not walking much b/c of decreased energy more than pain; sleeping on my left side; standing to cook a meal takes 2-3 hours Relieving factors: unsure  PRECAUTIONS: None   WEIGHT BEARING RESTRICTIONS: No  FALLS:  Has patient fallen in last 6 months? No  LIVING ENVIRONMENT: Lives with: lives with their spouse Lives in: House/apartment    OCCUPATION: retired  PLOF: Independent  PATIENT GOALS: I want to walk 20-30 min (1-2 miles); play for 1 hour with grandchildren;  take grandchildren to [employer] jumping park  NEXT MD VISIT: not scheduled  OBJECTIVE:    PATIENT SURVEYS:  FOTO 38%  COGNITION: Overall cognitive status: Within functional limits for tasks assessed     PALPATION: Tender points in left gluteals, left ITB and left greater trochanteric bursa  LOWER EXTREMITY ROM: grossly WFLs except decreased left hip external rotation 35 degrees vs. 45 on  right;  decreased left hip flexor length  LOWER EXTREMITY MMT:  MMT Right eval Left eval  Hip flexion 5 4  Hip extension 5 4  Hip abduction 4- 4-  Hip adduction    Hip internal rotation    Hip external rotation 4 4  Knee flexion    Knee extension 5 4  Ankle dorsiflexion    Ankle plantarflexion    Ankle inversion    Ankle eversion     (Blank rows = not tested)  LOWER EXTREMITY SPECIAL TESTS:  SLS:  5 sec  on right with moderate lateral trunk lean;  5 sec on left with pelvic drop  FUNCTIONAL TESTS:  5x sit to stand 18.51 no UE use required To pick up small item from floor uses hand on thigh to push up to rise Able to transfer to floor  but needs UE assist to rise 2 min walk test to be done next visit  GAIT: Comments: decreased stride length   TODAY'S TREATMENT:   DATE: 9/26: Manual therapy: soft tissue mobilization to gluteals and lumbar musculature Trigger Point Dry-Needling  Treatment instructions: Expect mild to moderate muscle soreness. S/S of pneumothorax if dry needled over a lung field, and to seek immediate medical attention should they occur. Patient verbalized understanding of these instructions and education.  Patient Consent Given: Yes Education handout provided: Yes Muscles treated: prone left gluteals, left lumbar multifidi Electrical stimulation performed:yes  Parameters: 1.5 ma 10 min Treatment response/outcome: improved soft tissue mobility and dec tender points  Heat 2 min following DATE: 9/17: Doorway ITB stretch with UE reachover 2 sets of 5 Standing leaning on table: fire hydrant 10x right/left (hurts hands, suggested lean on elbows) Sit to stand 10x without Ues; sit to stand holding dumbbell 8# 10x  Dead lift single dumbbell 8# to 5 inch target 10x with cue for forward gaze Manual therapy: soft tissue mobilization to gluteals and ITB in sidelying Trigger Point Dry-Needling  Treatment instructions: Expect mild to moderate muscle soreness. S/S of pneumothorax if dry needled over a lung field, and to seek immediate medical attention should they occur. Patient verbalized understanding of these instructions and education.  Patient Consent Given: Yes Education handout provided: Yes Muscles treated: sidelying left gluteals, piriformis Electrical stimulation performed:yes  Parameters: 1.5 ma 10 min Treatment response/outcome: improved soft tissue mobility and dec  tender points   DATE: 9/11: Doorway ITB stretch per HEP 1/2 kneel ITB/TFL stretch per HEP Sit to stand without Ues per HEP Manual therapy: soft tissue mobilization to gluteals and ITB in sidelying Trigger Point Dry-Needling  Treatment instructions: Expect mild to moderate muscle soreness. S/S of pneumothorax if dry needled over a lung field, and to seek immediate medical attention should they occur. Patient verbalized understanding of these instructions and education.  Patient Consent Given: Yes Education handout provided: Yes Muscles treated: sidelying left gluteals, piriformis, ITB Electrical stimulation performed: No Parameters: N/A Treatment response/outcome: improved soft tissue mobility and dec tender poit s  DATE: 8/16  Discussed  sit to stand (8 reps) at a time to build strength in quad muscles   PATIENT EDUCATION:  Education details: Educated patient on anatomy and physiology of current symptoms, prognosis, plan of care as well as initial self care strategies to promote recovery Person educated: Patient Education method: Explanation Education comprehension: verbalized understanding  HOME EXERCISE PROGRAM: Access Code: [facility] URL: [email or web address] Date: 11/16/2022 Prepared by: Lavinia Sharps  Exercises - Standing ITB Stretch  - 1 x daily - 7 x weekly - 1 sets - 3 reps - 30 hold - Half Kneeling ITB/TFL Stretch  - 1 x daily - 7 x weekly - 1 sets - 3 reps - 30 hold - Sit to Stand  - 1 x daily - 7 x weekly - 1 sets - 10 reps  ASSESSMENT:  CLINICAL IMPRESSION: The patient benefits significantly from dry needling combined with ES to stimulate underlying myofascial trigger points and muscular tissue for management of neuromusculoskeletal pain and address movement impairments.  Much improved soft tissue mobility and decreased  tender point size and number following treatment session.   Will continue to encourage exercise to optimize long term benefit.    OBJECTIVE IMPAIRMENTS: decreased activity tolerance, decreased mobility, difficulty walking, decreased ROM, decreased strength, increased fascial restrictions, and pain.   ACTIVITY LIMITATIONS: carrying, lifting, bending, sitting, standing, squatting, sleeping, stairs, and locomotion level  PARTICIPATION LIMITATIONS: meal prep, cleaning, laundry, shopping, and community activity  PERSONAL FACTORS: Time since onset of injury/illness/exacerbation and 1 comorbidity: history of knee and foot issues  are also affecting patient's functional outcome.   REHAB POTENTIAL: Good  CLINICAL DECISION MAKING: Stable/uncomplicated  EVALUATION COMPLEXITY: Low   GOALS: Goals reviewed with patient? Yes  SHORT TERM GOALS: Target date: 11/18/2022   The patient will demonstrate knowledge of basic self care strategies and exercises to promote healing  Baseline: Goal status: met 9/17  2.  The patient will report a 35% improvement in pain levels with functional activities which are currently difficult including  standing to cook, sleeping, taking her grandchildren to trampoline park Baseline:  Goal status: ongoing  3.  The patient will be able to walk 5-10 minutes with pain level 4/10 Baseline:  Goal status: met 9/26  4.  Improved LE strength for squatting to pick up objects from the floor without pushing up on her thigh Baseline:  Goal status: met 9/13    LONG TERM GOALS: Target date: 12/16/2022  The patient will be independent in a safe self progression of a home exercise program to promote further recovery of function  Baseline:  Goal status: INITIAL  2.  The patient will report a 65% improvement in pain levels with functional activities which are currently difficult including walking  and standing to cook Baseline:  Goal status: INITIAL  3.  The patient will  have improved knee and hip strength to at least 4+/5 needed for standing to cook, walking longer distances with her grandchildren Baseline:  Goal status: INITIAL  4.  The patient will be able to walk 20 minutes for a walking program for health Baseline:  Goal status: INITIAL  5.  The patient will have improved FOTO score to    55%   indicating improved function with less pain Baseline:  Goal status: INITIAL  6. Patient will be able to ascend and descend the steps to go upstairs in her home Initial PLAN:  PT FREQUENCY: 2x/week  PT DURATION: 8 weeks  PLANNED INTERVENTIONS: Therapeutic exercises, Therapeutic activity,  Neuromuscular re-education, Balance training, Gait training, Patient/Family education, Self Care, Joint mobilization, Aquatic Therapy, Dry Needling, Electrical stimulation, Cryotherapy, Moist heat, Taping, Ultrasound, Ionotophoresis 4mg /ml Dexamethasone, Manual therapy, and Re-evaluation  PLAN FOR NEXT SESSION: sit to stand no hands, step ups,  dead lifts; leg swings off step;  standing clams;  DN  left gluteals and piriformis with ES  Lavinia Sharps, PT 12/01/22 9:23 PM Phone: [phone] Fax: [phone]

## 2022-12-02 ENCOUNTER — Other Ambulatory Visit: Payer: Self-pay | Admitting: Cardiology

## 2022-12-02 DIAGNOSIS — R0602 Shortness of breath: Secondary | ICD-10-CM

## 2022-12-02 DIAGNOSIS — R072 Precordial pain: Secondary | ICD-10-CM

## 2022-12-06 ENCOUNTER — Ambulatory Visit: Payer: Medicare Other | Attending: Internal Medicine | Admitting: Physical Therapy

## 2022-12-06 DIAGNOSIS — M25552 Pain in left hip: Secondary | ICD-10-CM | POA: Diagnosis present

## 2022-12-06 DIAGNOSIS — M6281 Muscle weakness (generalized): Secondary | ICD-10-CM | POA: Diagnosis present

## 2022-12-06 DIAGNOSIS — R262 Difficulty in walking, not elsewhere classified: Secondary | ICD-10-CM | POA: Diagnosis present

## 2022-12-06 NOTE — Therapy (Signed)
OUTPATIENT PHYSICAL THERAPY LOWER EXTREMITY PROGRESS NOTE   Patient Name: Adrienne Luna MRN: [id number] DOB:Jul 25, 1945, 77 y.o., female Today's Date: 12/06/2022  END OF SESSION:  PT End of Session - 12/06/22 1148     Visit Number 5    Date for PT Re-Evaluation 12/16/22    Authorization Type medicare A/B    Progress Note Due on Visit 10    PT Start Time 1149    PT Stop Time 1230    PT Time Calculation (min) 41 min    Activity Tolerance Patient tolerated treatment well             Past Medical History:  Diagnosis Date   IBS (irritable bowel syndrome)    Past Surgical History:  Procedure Laterality Date   ABDOMINAL HYSTERECTOMY     CESAREAN SECTION     x2   Patient Active Problem List   Diagnosis Date Noted   Diverticulitis of intestine, part unspecified, without perforation or abscess without bleeding 04/28/2020   Unspecified menopausal and perimenopausal disorder 12/14/2016   Pain in right shoulder 12/14/2016   Other specified disorders of bone density and structure, unspecified site 12/14/2016   Insomnia, unspecified 12/14/2016   Hyperlipidemia, unspecified 12/14/2016   History of colonic polyps 12/14/2016   Gastro-esophageal reflux disease without esophagitis 12/14/2016   Essential (primary) hypertension 12/14/2016   Vitamin D deficiency, unspecified 12/14/2016   Chronic migraine without aura without status migrainosus, not intractable 12/16/2014   Aneurysm of superficial temporal artery (HCC) 12/15/2014    PCP: Hillard Danker MD  REFERRING PROVIDER:  Hillard Danker MD  REFERRING DIAG: M25.552 pain in left hip  THERAPY DIAG:  Left hip pain; weakness  Rationale for Evaluation and Treatment: Rehabilitation  ONSET DATE: Jan 2024  SUBJECTIVE:   SUBJECTIVE STATEMENT: I felt good for 2 days after last session.  But it's flared up again.  DN helps temporarily.  It hurts to touch it.  I've been doing the easier ex's.  I tried to walk some.  Lifted/moved the  living room furniture to readjust the rug yesterday.  PERTINENT HISTORY: Recent right carpal tunnel surgery 1 month ago Occipital neuralgia; cervical stenosis (nerve ablation) GERD (Nexium);  preventive med for BP;  aspirin Surgery in August colonectomy had a blood clot in leg Vit D3 Night take Prevastatin Husband has medical issues and she's been caregiver 4 foot surgeries on right foot left meniscus tear 2022 had injections  PAIN:   Are you having pain? Yes NPRS scale: 7/10 Pain location: left upper posterior hip  Aggravating factors: avoids stairs; not walking much b/c of decreased energy more than pain; sleeping on my left side; standing to cook a meal takes 2-3 hours Relieving factors: unsure  PRECAUTIONS: None   WEIGHT BEARING RESTRICTIONS: No  FALLS:  Has patient fallen in last 6 months? No  LIVING ENVIRONMENT: Lives with: lives with their spouse Lives in: House/apartment    OCCUPATION: retired  PLOF: Independent  PATIENT GOALS: I want to walk 20-30 min (1-2 miles); play for 1 hour with grandchildren;  take grandchildren to [employer]/[address] jumping park  NEXT MD VISIT: not scheduled  OBJECTIVE:    PATIENT SURVEYS:  FOTO 38%  COGNITION: Overall cognitive status: Within functional limits for tasks assessed     PALPATION: Tender points in left gluteals, left ITB and left greater trochanteric bursa  LOWER EXTREMITY ROM: grossly WFLs except decreased left hip external rotation 35 degrees vs. 45 on right;  decreased left hip flexor length  LOWER  EXTREMITY MMT:  MMT Right eval Left eval  Hip flexion 5 4  Hip extension 5 4  Hip abduction 4- 4-  Hip adduction    Hip internal rotation    Hip external rotation 4 4  Knee flexion    Knee extension 5 4  Ankle dorsiflexion    Ankle plantarflexion    Ankle inversion    Ankle eversion     (Blank rows = not tested)  LOWER EXTREMITY SPECIAL TESTS:  SLS:  5 sec on right with moderate lateral trunk lean;   5 sec on left with pelvic drop  FUNCTIONAL TESTS:  5x sit to stand 18.51 no UE use required To pick up small item from floor uses hand on thigh to push up to rise Able to transfer to floor  but needs UE assist to rise 2 min walk test to be done next visit  GAIT: Comments: decreased stride length   TODAY'S TREATMENT:   DATE: 10/1: Review of HEP: pt states she does sit to stands without difficulty Recommend standing donkey kicks and fire hydrants for glute activation Sidelying clams left 10x Sidelying hip abduction left 10x Manual therapy: soft tissue mobilization to gluteals  Trigger Point Dry-Needling  Treatment instructions: Expect mild to moderate muscle soreness. S/S of pneumothorax if dry needled over a lung field, and to seek immediate medical attention should they occur. Patient verbalized understanding of these instructions and education.  Patient Consent Given: Yes Education handout provided: Yes Muscles treated: sidelying left gluteals Electrical stimulation performed:yes  Parameters: 1.5 ma 8 min Treatment response/outcome: improved soft tissue mobility and dec tender points  Heat 2 min following DATE: 9/26: Manual therapy: soft tissue mobilization to gluteals and lumbar musculature Trigger Point Dry-Needling  Treatment instructions: Expect mild to moderate muscle soreness. S/S of pneumothorax if dry needled over a lung field, and to seek immediate medical attention should they occur. Patient verbalized understanding of these instructions and education.  Patient Consent Given: Yes Education handout provided: Yes Muscles treated: prone left gluteals, left lumbar multifidi Electrical stimulation performed:yes  Parameters: 1.5 ma 10 min Treatment response/outcome: improved soft tissue mobility and dec tender points  Heat 2 min following DATE: 9/17: Doorway ITB stretch with UE reachover 2 sets of 5 Standing leaning on table: fire hydrant 10x right/left (hurts hands,  suggested lean on elbows) Sit to stand 10x without Ues; sit to stand holding dumbbell 8# 10x  Dead lift single dumbbell 8# to 5 inch target 10x with cue for forward gaze Manual therapy: soft tissue mobilization to gluteals and ITB in sidelying Trigger Point Dry-Needling  Treatment instructions: Expect mild to moderate muscle soreness. S/S of pneumothorax if dry needled over a lung field, and to seek immediate medical attention should they occur. Patient verbalized understanding of these instructions and education.  Patient Consent Given: Yes Education handout provided: Yes Muscles treated: sidelying left gluteals, piriformis Electrical stimulation performed:yes  Parameters: 1.5 ma 10 min Treatment response/outcome: improved soft tissue mobility and dec tender points   DATE: 9/11: Doorway ITB stretch per HEP 1/2 kneel ITB/TFL stretch per HEP Sit to stand without Ues per HEP Manual therapy: soft tissue mobilization to gluteals and ITB in sidelying Trigger Point Dry-Needling  Treatment instructions: Expect mild to moderate muscle soreness. S/S of pneumothorax if dry needled over a lung field, and to seek immediate medical attention should they occur. Patient verbalized understanding of these instructions and education.  Patient Consent Given: Yes Education handout provided: Yes Muscles treated: sidelying left  gluteals, piriformis, ITB Electrical stimulation performed: No Parameters: N/A Treatment response/outcome: improved soft tissue mobility and dec tender poit s    PATIENT EDUCATION:  Education details: Educated patient on anatomy and physiology of current symptoms, prognosis, plan of care as well as initial self care strategies to promote recovery Person educated: Patient Education method: Explanation Education comprehension: verbalized understanding  HOME EXERCISE PROGRAM: Access Code: [facility] URL: [email or web address] Date: 11/16/2022 Prepared by: Lavinia Sharps  Exercises - Standing ITB Stretch  - 1 x daily - 7 x weekly - 1 sets - 3 reps - 30 hold - Half Kneeling ITB/TFL Stretch  - 1 x daily - 7 x weekly - 1 sets - 3 reps - 30 hold - Sit to Stand  - 1 x daily - 7 x weekly - 1 sets - 10 reps  ASSESSMENT:  CLINICAL IMPRESSION: The patient reports pain relief for 2 days after session but the pain returns at a moderate to high intensity.  Today's intensity is rated at a 7/10 instead of 10/10 (last visit.)  She is able to perform low level gluteal activation ex's without pain exacerbation.  Good initial response to DN combined with ES.  The patient was encouraged in regular performance of HEP post DN including soft tissue lengthening and strengthening exercises to enhance long term benefit.   OBJECTIVE IMPAIRMENTS: decreased activity tolerance, decreased mobility, difficulty walking, decreased ROM, decreased strength, increased fascial restrictions, and pain.   ACTIVITY LIMITATIONS: carrying, lifting, bending, sitting, standing, squatting, sleeping, stairs, and locomotion level  PARTICIPATION LIMITATIONS: meal prep, cleaning, laundry, shopping, and community activity  PERSONAL FACTORS: Time since onset of injury/illness/exacerbation and 1 comorbidity: history of knee and foot issues  are also affecting patient's functional outcome.   REHAB POTENTIAL: Good  CLINICAL DECISION MAKING: Stable/uncomplicated  EVALUATION COMPLEXITY: Low   GOALS: Goals reviewed with patient? Yes  SHORT TERM GOALS: Target date: 11/18/2022   The patient will demonstrate knowledge of basic self care strategies and exercises to promote healing  Baseline: Goal status: met 9/17  2.  The patient will report a 35% improvement in pain levels with functional activities which are currently difficult including  standing to cook, sleeping, taking her grandchildren to trampoline park Baseline:  Goal status: ongoing  3.  The patient will be able to walk 5-10 minutes  with pain level 4/10 Baseline:  Goal status: met 9/26  4.  Improved LE strength for squatting to pick up objects from the floor without pushing up on her thigh Baseline:  Goal status: met 9/13    LONG TERM GOALS: Target date: 12/16/2022  The patient will be independent in a safe self progression of a home exercise program to promote further recovery of function  Baseline:  Goal status: INITIAL  2.  The patient will report a 65% improvement in pain levels with functional activities which are currently difficult including walking  and standing to cook Baseline:  Goal status: INITIAL  3.  The patient will have improved knee and hip strength to at least 4+/5 needed for standing to cook, walking longer distances with her grandchildren Baseline:  Goal status: INITIAL  4.  The patient will be able to walk 20 minutes for a walking program for health Baseline:  Goal status: INITIAL  5.  The patient will have improved FOTO score to    55%   indicating improved function with less pain Baseline:  Goal status: INITIAL  6. Patient will be able to ascend and descend the  steps to go upstairs in her home Initial PLAN:  PT FREQUENCY: 2x/week  PT DURATION: 8 weeks  PLANNED INTERVENTIONS: Therapeutic exercises, Therapeutic activity, Neuromuscular re-education, Balance training, Gait training, Patient/Family education, Self Care, Joint mobilization, Aquatic Therapy, Dry Needling, Electrical stimulation, Cryotherapy, Moist heat, Taping, Ultrasound, Ionotophoresis 4mg /ml Dexamethasone, Manual therapy, and Re-evaluation  PLAN FOR NEXT SESSION: try band with clams; sit to stand no hands, step ups,  dead lifts; leg swings off step;  standing clams;  DN  left gluteals and piriformis with ES  Lavinia Sharps, PT 12/06/22 4:56 PM Phone: [phone] Fax: [phone]

## 2022-12-08 ENCOUNTER — Ambulatory Visit: Payer: Medicare Other | Admitting: Physical Therapy

## 2022-12-13 ENCOUNTER — Ambulatory Visit: Payer: Medicare Other | Admitting: Physical Therapy

## 2022-12-13 DIAGNOSIS — M25552 Pain in left hip: Secondary | ICD-10-CM

## 2022-12-13 DIAGNOSIS — R262 Difficulty in walking, not elsewhere classified: Secondary | ICD-10-CM

## 2022-12-13 DIAGNOSIS — M6281 Muscle weakness (generalized): Secondary | ICD-10-CM

## 2022-12-13 NOTE — Therapy (Signed)
OUTPATIENT PHYSICAL THERAPY LOWER EXTREMITY PROGRESS NOTE   Patient Name: Adrienne Luna MRN: [id number] DOB:[birth year], 77 y.o., female Today's Date: 12/13/2022  END OF SESSION:  PT End of Session - 12/13/22 1155     Visit Number 6    Date for PT Re-Evaluation 12/16/22    Authorization Type medicare A/B    Progress Note Due on Visit 10    PT Start Time 1150    PT Stop Time 1230    PT Time Calculation (min) 40 min    Activity Tolerance Patient tolerated treatment well             Past Medical History:  Diagnosis Date   IBS (irritable bowel syndrome)    Past Surgical History:  Procedure Laterality Date   ABDOMINAL HYSTERECTOMY     CESAREAN SECTION     x2   Patient Active Problem List   Diagnosis Date Noted   Diverticulitis of intestine, part unspecified, without perforation or abscess without bleeding 04/28/2020   Unspecified menopausal and perimenopausal disorder 12/14/2016   Pain in right shoulder 12/14/2016   Other specified disorders of bone density and structure, unspecified site 12/14/2016   Insomnia, unspecified 12/14/2016   Hyperlipidemia, unspecified 12/14/2016   History of colonic polyps 12/14/2016   Gastro-esophageal reflux disease without esophagitis 12/14/2016   Essential (primary) hypertension 12/14/2016   Vitamin D deficiency, unspecified 12/14/2016   Chronic migraine without aura without status migrainosus, not intractable 12/16/2014   Aneurysm of superficial temporal artery (HCC) 12/15/2014    PCP: Hillard Danker MD  REFERRING PROVIDER:  Hillard Danker MD  REFERRING DIAG: M25.552 pain in left hip  THERAPY DIAG:  Left hip pain; weakness  Rationale for Evaluation and Treatment: Rehabilitation  ONSET DATE: Jan 2024  SUBJECTIVE:   SUBJECTIVE STATEMENT: I've been in so much pain I went to the chiro for my neck and he also adjusted my hip and did the laser thing.  I do think the DN helps but we may not need to do it today.  I have to go back  to [employer] this afternoon.  "I don't feel strong." PERTINENT HISTORY: Recent right carpal tunnel surgery 1 month ago Occipital neuralgia; cervical stenosis (nerve ablation) GERD (Nexium);  preventive med for BP;  aspirin Surgery in August colonectomy had a blood clot in leg Vit D3 Night take Prevastatin Husband has medical issues and she's been caregiver 4 foot surgeries on right foot left meniscus tear 2022 had injections  PAIN:   Are you having pain? Yes NPRS scale: 5/10 Pain location: left upper posterior hip  Aggravating factors: avoids stairs; not walking much b/c of decreased energy more than pain; sleeping on my left side; standing to cook a meal takes 2-3 hours Relieving factors: unsure  PRECAUTIONS: None   WEIGHT BEARING RESTRICTIONS: No  FALLS:  Has patient fallen in last 6 months? No  LIVING ENVIRONMENT: Lives with: lives with their spouse Lives in: House/apartment    OCCUPATION: retired  PLOF: Independent  PATIENT GOALS: I want to walk 20-30 min (1-2 miles); play for 1 hour with grandchildren;  take grandchildren to [employer] jumping park  NEXT MD VISIT: not scheduled  OBJECTIVE:    PATIENT SURVEYS:  FOTO 38%  COGNITION: Overall cognitive status: Within functional limits for tasks assessed     PALPATION: Tender points in left gluteals, left ITB and left greater trochanteric bursa  LOWER EXTREMITY ROM: grossly WFLs except decreased left hip external rotation 35 degrees vs. 45 on right;  decreased left hip flexor length  LOWER EXTREMITY MMT:  MMT Right eval Left eval  Hip flexion 5 4  Hip extension 5 4  Hip abduction 4- 4-  Hip adduction    Hip internal rotation    Hip external rotation 4 4  Knee flexion    Knee extension 5 4  Ankle dorsiflexion    Ankle plantarflexion    Ankle inversion    Ankle eversion     (Blank rows = not tested)  LOWER EXTREMITY SPECIAL TESTS:  SLS:  5 sec on right with moderate lateral  trunk lean;  5 sec on left with pelvic drop  FUNCTIONAL TESTS:  5x sit to stand 18.51 no UE use required To pick up small item from floor uses hand on thigh to push up to rise Able to transfer to floor  but needs UE assist to rise 2 min walk test to be done next visit  GAIT: Comments: decreased stride length   TODAY'S TREATMENT:   DATE: 10/8: Standing red band hip abduction 8x right/left Nu-Step L3 (green machine) 5 min full loop Hip 45#: extension, flexion 10x each right/left Standing holding 2   5# weights dead lifts to mid shin 10x SLS with fingertip support right/left Discussion of the benefits of exercise for analgesic effects    DATE: 10/1: Review of HEP: pt states she does sit to stands without difficulty Recommend standing donkey kicks and fire hydrants for glute activation Sidelying clams left 10x Sidelying hip abduction left 10x Manual therapy: soft tissue mobilization to gluteals  Trigger Point Dry-Needling  Treatment instructions: Expect mild to moderate muscle soreness. S/S of pneumothorax if dry needled over a lung field, and to seek immediate medical attention should they occur. Patient verbalized understanding of these instructions and education.  Patient Consent Given: Yes Education handout provided: Yes Muscles treated: sidelying left gluteals Electrical stimulation performed:yes  Parameters: 1.5 ma 8 min Treatment response/outcome: improved soft tissue mobility and dec tender points  Heat 2 min following DATE: 9/26: Manual therapy: soft tissue mobilization to gluteals and lumbar musculature Trigger Point Dry-Needling  Treatment instructions: Expect mild to moderate muscle soreness. S/S of pneumothorax if dry needled over a lung field, and to seek immediate medical attention should they occur. Patient verbalized understanding of these instructions and education.  Patient Consent Given: Yes Education handout provided: Yes Muscles treated: prone left  gluteals, left lumbar multifidi Electrical stimulation performed:yes  Parameters: 1.5 ma 10 min Treatment response/outcome: improved soft tissue mobility and dec tender points  Heat 2 min following DATE: 9/17: Doorway ITB stretch with UE reachover 2 sets of 5 Standing leaning on table: fire hydrant 10x right/left (hurts hands, suggested lean on elbows) Sit to stand 10x without Ues; sit to stand holding dumbbell 8# 10x  Dead lift single dumbbell 8# to 5 inch target 10x with cue for forward gaze Manual therapy: soft tissue mobilization to gluteals and ITB in sidelying Trigger Point Dry-Needling  Treatment instructions: Expect mild to moderate muscle soreness. S/S of pneumothorax if dry needled over a lung field, and to seek immediate medical attention should they occur. Patient verbalized understanding of these instructions and education.  Patient Consent Given: Yes Education handout provided: Yes Muscles treated: sidelying left gluteals, piriformis Electrical stimulation performed:yes  Parameters: 1.5 ma 10 min Treatment response/outcome: improved soft tissue mobility and dec tender points     PATIENT EDUCATION:  Education details: Educated patient on anatomy and physiology of current symptoms, prognosis, plan of care as well  as initial self care strategies to promote recovery Person educated: Patient Education method: Explanation Education comprehension: verbalized understanding  HOME EXERCISE PROGRAM: Access Code: [facility] URL: [email or web address] Date: 11/16/2022 Prepared by: Lavinia Sharps  Exercises - Standing ITB Stretch  - 1 x daily - 7 x weekly - 1 sets - 3 reps - 30 hold - Half Kneeling ITB/TFL Stretch  - 1 x daily - 7 x weekly - 1 sets - 3 reps - 30 hold - Sit to Stand  - 1 x daily - 7 x weekly - 1 sets - 10 reps  ASSESSMENT:  CLINICAL IMPRESSION: Progressed exercises today although intentionally underdosed exercise secondary to pt fearfulness of  symptom exacerbation.  Therapist monitoring response during and between all exercises with patient reporting no increase in hip pain.  At the end of session she states it might be a little better than a 5.  Progress has been slowed secondary to a long history of pain in this region.     OBJECTIVE IMPAIRMENTS: decreased activity tolerance, decreased mobility, difficulty walking, decreased ROM, decreased strength, increased fascial restrictions, and pain.   ACTIVITY LIMITATIONS: carrying, lifting, bending, sitting, standing, squatting, sleeping, stairs, and locomotion level  PARTICIPATION LIMITATIONS: meal prep, cleaning, laundry, shopping, and community activity  PERSONAL FACTORS: Time since onset of injury/illness/exacerbation and 1 comorbidity: history of knee and foot issues  are also affecting patient's functional outcome.   REHAB POTENTIAL: Good  CLINICAL DECISION MAKING: Stable/uncomplicated  EVALUATION COMPLEXITY: Low   GOALS: Goals reviewed with patient? Yes  SHORT TERM GOALS: Target date: 11/18/2022   The patient will demonstrate knowledge of basic self care strategies and exercises to promote healing  Baseline: Goal status: met 9/17  2.  The patient will report a 35% improvement in pain levels with functional activities which are currently difficult including  standing to cook, sleeping, taking her grandchildren to trampoline park Baseline:  Goal status: ongoing  3.  The patient will be able to walk 5-10 minutes with pain level 4/10 Baseline:  Goal status: met 9/26  4.  Improved LE strength for squatting to pick up objects from the floor without pushing up on her thigh Baseline:  Goal status: met 9/13    LONG TERM GOALS: Target date: 12/16/2022  The patient will be independent in a safe self progression of a home exercise program to promote further recovery of function  Baseline:  Goal status: INITIAL  2.  The patient will report a 65% improvement in pain levels  with functional activities which are currently difficult including walking  and standing to cook Baseline:  Goal status: INITIAL  3.  The patient will have improved knee and hip strength to at least 4+/5 needed for standing to cook, walking longer distances with her grandchildren Baseline:  Goal status: INITIAL  4.  The patient will be able to walk 20 minutes for a walking program for health Baseline:  Goal status: INITIAL  5.  The patient will have improved FOTO score to    55%   indicating improved function with less pain Baseline:  Goal status: INITIAL  6. Patient will be able to ascend and descend the steps to go upstairs in her home Initial PLAN:  PT FREQUENCY: 2x/week  PT DURATION: 8 weeks  PLANNED INTERVENTIONS: Therapeutic exercises, Therapeutic activity, Neuromuscular re-education, Balance training, Gait training, Patient/Family education, Self Care, Joint mobilization, Aquatic Therapy, Dry Needling, Electrical stimulation, Cryotherapy, Moist heat, Taping, Ultrasound, Ionotophoresis 4mg /ml Dexamethasone, Manual therapy, and Re-evaluation  PLAN FOR  NEXT SESSION:ERO next visit;  check response to ex; Nu-Step; leg press; band with clams; sit to stand no hands, step ups,  dead lifts; leg swings off step;  standing clams;  DN  left gluteals and piriformis with ES  Lavinia Sharps, PT 12/13/22 1:55 PM Phone: [phone] Fax: [phone]

## 2022-12-14 ENCOUNTER — Ambulatory Visit: Payer: Medicare Other | Attending: Internal Medicine

## 2022-12-14 DIAGNOSIS — R072 Precordial pain: Secondary | ICD-10-CM | POA: Diagnosis present

## 2022-12-14 DIAGNOSIS — R0602 Shortness of breath: Secondary | ICD-10-CM | POA: Diagnosis present

## 2022-12-14 LAB — EXERCISE TOLERANCE TEST
Angina Index: 0
Duke Treadmill Score: 2
Estimated workload: 4.6
Exercise duration (min): 2 min
Exercise duration (sec): 29 s
MPHR: 144 {beats}/min
Peak HR: 144 {beats}/min
Percent HR: 100 %
RPE: 17
Rest HR: 56 {beats}/min
ST Depression (mm): 0 mm

## 2022-12-15 ENCOUNTER — Ambulatory Visit: Payer: Medicare Other | Admitting: Physical Therapy

## 2022-12-20 ENCOUNTER — Ambulatory Visit: Payer: Medicare Other | Admitting: Physical Therapy

## 2022-12-22 ENCOUNTER — Encounter: Payer: Medicare Other | Admitting: Physical Therapy

## 2022-12-27 ENCOUNTER — Encounter: Payer: Medicare Other | Admitting: Physical Therapy

## 2022-12-29 ENCOUNTER — Ambulatory Visit: Payer: Medicare Other | Admitting: Physical Therapy

## 2023-01-03 ENCOUNTER — Ambulatory Visit: Payer: Medicare Other | Admitting: Physical Therapy

## 2023-01-05 ENCOUNTER — Ambulatory Visit (HOSPITAL_COMMUNITY): Payer: Medicare Other | Attending: Cardiology

## 2023-01-05 ENCOUNTER — Encounter: Payer: Medicare Other | Admitting: Physical Therapy

## 2023-01-05 DIAGNOSIS — R0602 Shortness of breath: Secondary | ICD-10-CM

## 2023-01-05 DIAGNOSIS — R072 Precordial pain: Secondary | ICD-10-CM | POA: Diagnosis not present

## 2023-01-05 LAB — ECHOCARDIOGRAM COMPLETE
Area-P 1/2: 2.57 cm2
S' Lateral: 2.6 cm

## 2023-01-08 NOTE — Progress Notes (Signed)
Echocardiogram 01/05/2023:   1. Left ventricular ejection fraction, by estimation, is 70 to 75%. The left ventricle has hyperdynamic function. The left ventricle has no regional wall motion abnormalities. Left ventricular diastolic parameters were normal.  2. Right ventricular systolic function is normal. The right ventricular size is normal.  3. The mitral valve is normal in structure. Trivial mitral valve regurgitation.  4. The aortic valve is normal in structure. Aortic valve regurgitation is not visualized.

## 2023-01-17 ENCOUNTER — Telehealth: Payer: Self-pay | Admitting: Neurology

## 2023-01-17 ENCOUNTER — Ambulatory Visit: Payer: Medicare Other | Admitting: Neurology

## 2023-01-17 ENCOUNTER — Encounter: Payer: Self-pay | Admitting: Neurology

## 2023-01-17 VITALS — BP 136/78 | Resp 16 | Ht 63.5 in | Wt 178.5 lb

## 2023-01-17 DIAGNOSIS — G243 Spasmodic torticollis: Secondary | ICD-10-CM

## 2023-01-17 DIAGNOSIS — M542 Cervicalgia: Secondary | ICD-10-CM

## 2023-01-17 DIAGNOSIS — G43709 Chronic migraine without aura, not intractable, without status migrainosus: Secondary | ICD-10-CM | POA: Diagnosis not present

## 2023-01-17 MED ORDER — BUTALBITAL-APAP-CAFFEINE 50-325-40 MG PO TABS: 1.0000 | ORAL_TABLET | Freq: Four times a day (QID) | ORAL | 3 refills | Status: AC | PRN

## 2023-01-17 NOTE — Telephone Encounter (Signed)
Received Botox start form from Dr. Terrace Arabia. I went ahead and scheduled the pt for 02/08/2023 @ 2:00 pm. I initiated benefit verification, will update once results are received. [id number]  200 units CPT codes: 78469, 64616, 95874 ICD-10: G43.709, G24.3

## 2023-01-17 NOTE — Progress Notes (Signed)
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 Has: daily, triggers: unidentifiable   ASSESSMENT AND PLAN  Adrienne Luna is a 77 y.o. female   Chronic migraine Abnormal neck posturing, neck pain  Prior authorization for Botox 200 units, responded very well to Botox injection every 3 months in the past, last injection was April 2022 at [facility] as needed  DIAGNOSTIC DATA (LABS, IMAGING, TESTING) - I reviewed patient records, labs, notes, testing and imaging myself where available.   MEDICAL HISTORY:  Adrienne Luna, is a 77 year old female seen in request by Dr. Margaretann Loveless, Electa Sniff P, for evaluation of neck pain, headache, initial evaluation was on January 17, 2023    History is obtained from the patient and review of electronic medical records. I personally reviewed pertinent available imaging films in PACS.   PMHx of  HLD GERD  Patient reported many years history of chronic migraine headaches, usually starting at the neck, deep at the occipital region, as if she has broken glasses in her cervical region, crackling sound when she moves her neck, then spreading forwards, deep achy pain, sometimes pounding, sleeping dark quiet room was helpful  Over the years, she has managed by neck exercises, stretching, Fioricet as needed was helpful,  She was put on Botox injection as migraine prevention since 2020 at [facility], was very helpful, last injection was April 2022,  She recently moved from [place] to [place] in February 2022, missing her Botox injection, she began to have more frequent headaches, now 2-3 times each week, most headache starting at  her neck, sometimes go down her shoulder, or spreading forward to bilateral temporoparietal region, with associated light noise sensitivity, can lasting for hours or even days,  Laboratory evaluation from November 2023, normal CBC hemoglobin of 12.9, CMP creatinine of 0.86, ESR of 9,  I was also able to review previous evaluation from [place], [facility] pain management by Dr. Wiliam Ke, most recent Botox injection was dated June 28, 2021,  X-ray of lumbar spine in April 2021, moderate degenerative changes of lumbar facet joints,  PHYSICAL EXAM:      01/17/2023   11:12 AM 11/23/2022   11:40 AM 10/18/2022    9:00 PM  Vitals with BMI  Height 5' 3.5" 5' 3.5"   Weight 178 lbs 8 oz 177 lbs 3 oz   BMI 31.12 30.89   Systolic 136 123 528  Diastolic 78 82 81  Pulse  58 58     PHYSICAL EXAMNIATION:  Gen: NAD, conversant, well nourised, well groomed                     Cardiovascular: Regular rate rhythm, no peripheral edema, warm, nontender. Eyes: Conjunctivae clear without exudates or hemorrhage Neck: Supple, no carotid bruits. Pulmonary: Clear to auscultation bilaterally   NEUROLOGICAL EXAM:  MENTAL STATUS: mild retrocollis, right shoulder elevation and left tilt Speech/cognition: Awake, alert, oriented to history taking and casual conversation CRANIAL NERVES: CN II: Visual fields are full to confrontation. Pupils are round equal and briskly reactive to light. CN III, IV, VI: extraocular movement are normal. No ptosis. CN V: Facial sensation is intact to light touch CN  VII: Face is symmetric with normal eye closure  CN VIII: Hearing is normal to causal conversation. CN IX, X: Phonation is normal. CN XI: Head turning and shoulder shrug are intact  MOTOR: There is no pronator drift of out-stretched arms. Muscle bulk and tone are normal. Muscle strength is normal.  REFLEXES: Reflexes are 2+ and symmetric at the biceps, triceps, knees, and ankles. Plantar  responses are flexor.  SENSORY: Intact to light touch, pinprick and vibratory sensation are intact in fingers and toes.  COORDINATION: There is no trunk or limb dysmetria noted.  GAIT/STANCE: Posture is normal. Gait is steady with normal steps, base, arm swing, and turning. Heel and toe walking are normal. Tandem gait is normal.  Romberg is absent.  REVIEW OF SYSTEMS:  Full 14 system review of systems performed and notable only for as above All other review of systems were negative.   ALLERGIES: Allergies  Allergen Reactions   Codeine Nausea And Vomiting    Other Reaction(s): vomiting   Meperidine Hcl Nausea And Vomiting    Other Reaction(s): vomiting   Promethazine Other (See Comments)    Was given an overdose and it made her feel terrible per patient.   Sulfa Antibiotics Nausea And Vomiting and Other (See Comments)   Ciprofloxacin Other (See Comments)   Sulfamethoxazole     Other Reaction(s): other   Trimethoprim     Other Reaction(s): Other (See Comments)    HOME MEDICATIONS: Current Outpatient Medications  Medication Sig Dispense Refill   aspirin 81 MG chewable tablet Chew 81 mg by mouth daily.     esomeprazole (NEXIUM) 10 MG packet Take 10 mg by mouth daily before breakfast.     polyethylene glycol (MIRALAX / GLYCOLAX) 17 g packet Take 17 g by mouth daily.     progesterone (PROMETRIUM) 200 MG capsule Take 200 mg by mouth daily.     rosuvastatin (CRESTOR) 10 MG tablet Take 10 mg by mouth daily.     No current facility-administered medications for this visit.    PAST MEDICAL HISTORY: Past Medical History:  Diagnosis Date   IBS (irritable bowel syndrome)     PAST SURGICAL HISTORY: Past Surgical History:  Procedure Laterality Date   ABDOMINAL HYSTERECTOMY     CESAREAN SECTION     x2    FAMILY HISTORY: History reviewed. No pertinent family history.  SOCIAL HISTORY: Social History   Socioeconomic History   Marital status: Married    Spouse name:  charlie   Number of children: 4   Years of education: Not on file   Highest education level: Associate degree: occupational, Scientist, product/process development, or vocational program  Occupational History   Not on file  Tobacco Use   Smoking status: Never   Smokeless tobacco: Never  Vaping Use   Vaping status: Never Used  Substance and Sexual Activity   Alcohol use: Yes    Alcohol/week: 7.0 standard drinks of alcohol    Types: 7 Glasses of wine per week   Drug use: Not Currently   Sexual activity: Not on file  Other Topics Concern   Not on file  Social History Narrative   Not on file   Social Determinants of Health   Financial Resource Strain: Not on file  Food Insecurity: Not on file  Transportation Needs: Not on file  Physical Activity: Not on file  Stress: Not on file  Social Connections: Unknown (07/17/2021)   [facility] from [facility]   Social Network    Social Network:  Not on file  Intimate Partner Violence: Unknown (06/18/2021)   Received from [facility], [facility]   HITS    Physically Hurt: Not on file    Insult or Talk Down To: Not on file    Threaten Physical Harm: Not on file    Scream or Curse: Not on file      Levert Feinstein, M.D. Ph.D.  [facility] [address] [place], [place] [address] Ph: [phone] Fax: [phone]  CC:  Thana Ates, MD [address] [place],  [place] [address]  Thana Ates, MD

## 2023-01-24 NOTE — Telephone Encounter (Signed)
Medicare A+B and Aetna supplement do not require prior auth, pt will be buy/bill.

## 2023-01-30 ENCOUNTER — Encounter: Payer: Self-pay | Admitting: Internal Medicine

## 2023-01-30 ENCOUNTER — Ambulatory Visit (INDEPENDENT_AMBULATORY_CARE_PROVIDER_SITE_OTHER): Payer: Medicare Other | Admitting: Internal Medicine

## 2023-01-30 VITALS — BP 138/86 | HR 61 | Temp 98.1°F | Ht 63.5 in | Wt 180.0 lb

## 2023-01-30 DIAGNOSIS — R0609 Other forms of dyspnea: Secondary | ICD-10-CM | POA: Diagnosis not present

## 2023-01-30 DIAGNOSIS — R062 Wheezing: Secondary | ICD-10-CM | POA: Diagnosis not present

## 2023-01-30 DIAGNOSIS — R053 Chronic cough: Secondary | ICD-10-CM | POA: Diagnosis not present

## 2023-01-30 DIAGNOSIS — R635 Abnormal weight gain: Secondary | ICD-10-CM | POA: Diagnosis not present

## 2023-01-30 LAB — CBC WITH DIFFERENTIAL/PLATELET
Basophils Absolute: 0.1 10*3/uL (ref 0.0–0.1)
Basophils Relative: 1.1 % (ref 0.0–3.0)
Eosinophils Absolute: 0.1 10*3/uL (ref 0.0–0.7)
Eosinophils Relative: 1.4 % (ref 0.0–5.0)
HCT: 41.2 % (ref 36.0–46.0)
Hemoglobin: 13.6 g/dL (ref 12.0–15.0)
Lymphocytes Relative: 12.8 % (ref 12.0–46.0)
Lymphs Abs: 0.7 10*3/uL (ref 0.7–4.0)
MCHC: 32.9 g/dL (ref 30.0–36.0)
MCV: 101.8 fL — ABNORMAL HIGH (ref 78.0–100.0)
Monocytes Absolute: 0.5 10*3/uL (ref 0.1–1.0)
Monocytes Relative: 9.3 % (ref 3.0–12.0)
Neutro Abs: 4.1 10*3/uL (ref 1.4–7.7)
Neutrophils Relative %: 75.4 % (ref 43.0–77.0)
Platelets: 269 10*3/uL (ref 150.0–400.0)
RBC: 4.05 Mil/uL (ref 3.87–5.11)
RDW: 13.6 % (ref 11.5–15.5)
WBC: 5.4 10*3/uL (ref 4.0–10.5)

## 2023-01-30 LAB — NITRIC OXIDE: Nitric Oxide: 16

## 2023-01-30 MED ORDER — FLUTICASONE FUROATE-VILANTEROL 100-25 MCG/ACT IN AEPB: 1.0000 | INHALATION_SPRAY | Freq: Every day | RESPIRATORY_TRACT | 11 refills | Status: DC

## 2023-01-30 MED ORDER — ALBUTEROL SULFATE HFA 108 (90 BASE) MCG/ACT IN AERS: 2.0000 | INHALATION_SPRAY | Freq: Four times a day (QID) | RESPIRATORY_TRACT | 2 refills | Status: AC | PRN

## 2023-01-30 NOTE — Progress Notes (Unsigned)
OV 01/30/2023  Subjective:  Patient ID: Adrienne Luna, female , DOB: 07-15-1945 , age 77 y.o. , MRN: [id number] , ADDRESS: [address] [place] [address] PCP Thana Ates, MD Patient Care Team: Thana Ates, MD as PCP - General (Internal Medicine)  This Provider for this visit: Treatment Team:  Attending Provider: Kalman Shan, MD    01/30/2023 -   Chief Complaint  Patient presents with   Pulmonary Consult    Referred by Dr. Sherilyn Banker. Pt c/o SOB and chest discomfort for the past year. She states that this started after moved here from [place]. She gets winded walking up stairs.      HPI Adrienne Luna 77 y.o. -is married to a retired Psychologist, occupational.  The used to live in [facility] and then several years ago moved to [place] to live in the [place] but as her husband's health deteriorated she and her husband moved back to [place] because of the better healthcare situation here.  She has been living here for 18 months.  During this time she is not as active as before she been exercising less.  She used to walk 3 miles in the [place] but now only 1 mile.  She gets dyspneic for that.  She is also gained 15 pounds of weight she is having dyspnea on exertion.  There are some associated cough and wheezing with this especially early in the morning.  She wants to get to the bottom of all this and therefore she is here.  Her exhaled nitric oxide test is normal: FENO 16  Lung parenchyma on the CT chest is essentially normal.   She did a simple exercise test that corresponds to 1 flight of stairs.  We made her sit and stand 15 times.  Resting pulse ox was 97% heart rate 66.  At the end she had a pulse ox of 95% and heart rate of 83..  She felt palpitations but actually there was no tachycardia.   CT Chest data from date: yes  - personally visualized and independently interpreted : yes - my findings are: as below CLINICAL DATA:   aortic ectasia; chest pain; no surgery   EXAM: CT ANGIOGRAPHY CHEST WITH CONTRAST   TECHNIQUE: Multidetector CT imaging of the chest was performed using the standard protocol during bolus administration of intravenous contrast. Multiplanar CT image reconstructions and MIPs were obtained to evaluate the vascular anatomy.   RADIATION DOSE REDUCTION: This exam was performed according to the departmental dose-optimization program which includes automated exposure control, adjustment of the mA and/or kV according to patient size and/or use of iterative reconstruction technique.   CONTRAST:  75mL ISOVUE-370 IOPAMIDOL (ISOVUE-370) INJECTION 76%   COMPARISON:  None Available.   FINDINGS: Cardiovascular: Preferential opacification of the thoracic aorta. No evidence of thoracic aortic aneurysm or dissection. Normal heart size. No significant pericardial effusion. Mild atherosclerotic plaque of the thoracic aorta. No coronary artery calcifications. The main pulmonary artery is normal in caliber. No pulmonary embolus.   Mediastinum/Nodes: No enlarged mediastinal, hilar, or axillary lymph nodes. Thyroid gland, trachea, and esophagus demonstrate no significant findings. Small hiatal hernia.   Lungs/Pleura: No focal consolidation. No pulmonary nodule. No pulmonary mass. No pleural effusion. No pneumothorax.   Upper Abdomen: Status post cholecystectomy   Musculoskeletal:   No chest wall abnormality.   No suspicious lytic or blastic osseous lesions. No acute displaced fracture.   Review of the MIP images confirms  the above findings.   IMPRESSION: 1. Small hiatal hernia. 2. No acute thoracic aorta abnormality. 3.  Aortic Atherosclerosis (ICD10-I70.0).     Electronically Signed   By: Tish Frederickson M.D.   On: 11/09/2022 00:39    PFT      No data to display            LAB RESULTS last 96 hours No results found.  LAB RESULTS last 90 days Recent Results (from the  past 2160 hour(s))  Exercise Tolerance Test     Status: None   Collection Time: 12/14/22 10:06 AM  Result Value Ref Range   Angina Index 0    Rest HR 56.0 bpm   Rest BP 146/101 mmHg   Exercise duration (min) 2 min   Exercise duration (sec) 29 sec   Estimated workload 4.6    Peak HR 144 bpm   Peak BP 174/94 mmHg   MPHR 144 bpm   Percent HR 100.0 %   RPE 17.0    Duke Treadmill Score 2    ST Depression (mm) 0 mm  ECHOCARDIOGRAM COMPLETE     Status: None   Collection Time: 01/05/23  4:04 PM  Result Value Ref Range   Area-P 1/2 2.57 cm2   S' Lateral 2.60 cm   Est EF 70 - 75%          has a past medical history of IBS (irritable bowel syndrome).   reports that she has never smoked. She has been exposed to tobacco smoke. She has never used smokeless tobacco.  Past Surgical History:  Procedure Laterality Date   ABDOMINAL HYSTERECTOMY     CESAREAN SECTION     x2    Allergies  Allergen Reactions   Codeine Nausea And Vomiting    Other Reaction(s): vomiting   Meperidine Hcl Nausea And Vomiting    Other Reaction(s): vomiting   Promethazine Other (See Comments)    Was given an overdose and it made her feel terrible per patient.   Sulfa Antibiotics Nausea And Vomiting and Other (See Comments)   Ciprofloxacin Other (See Comments)   Sulfamethoxazole     Other Reaction(s): other   Trimethoprim     Other Reaction(s): Other (See Comments)     There is no immunization history on file for this patient.  Family History  Problem Relation Age of Onset   Breast cancer Mother    Melanoma Mother    Lung cancer Father    Emphysema Father    COPD Brother    Lung cancer Brother    Lung cancer Brother      Current Outpatient Medications:    aspirin 81 MG chewable tablet, Chew 81 mg by mouth daily., Disp: , Rfl:    butalbital-acetaminophen-caffeine (FIORICET) 50-325-40 MG tablet, Take 1 tablet by mouth every 6 (six) hours as needed for headache., Disp: 12 tablet, Rfl: 3    esomeprazole (NEXIUM) 10 MG packet, Take 10 mg by mouth daily before breakfast., Disp: , Rfl:    estradiol (CLIMARA - DOSED IN MG/24 HR) 0.025 mg/24hr patch, Place 0.025 mg onto the skin once a week., Disp: , Rfl:    polyethylene glycol (MIRALAX / GLYCOLAX) 17 g packet, Take 17 g by mouth daily., Disp: , Rfl:    progesterone (PROMETRIUM) 200 MG capsule, Take 200 mg by mouth daily., Disp: , Rfl:    rosuvastatin (CRESTOR) 10 MG tablet, Take 10 mg by mouth daily., Disp: , Rfl:       Objective:  Vitals:   01/30/23 1051  BP: 138/86  Pulse: 61  Temp: 98.1 F (36.7 C)  TempSrc: Oral  SpO2: 97%  Weight: 180 lb (81.6 kg)  Height: 5' 3.5" (1.613 m)    Estimated body mass index is 31.39 kg/m as calculated from the following:   Height as of this encounter: 5' 3.5" (1.613 m).   Weight as of this encounter: 180 lb (81.6 kg).  @WEIGHTCHANGE @  Filed Weights   01/30/23 1051  Weight: 180 lb (81.6 kg)     Physical Exam   General: No distress. Looks well O2 at rest: no Cane present: no Sitting in wheel chair: no Frail: no Obese: no Neuro: Alert and Oriented x 3. GCS 15. Speech normal Psych: Pleasant Resp:  Barrel Chest - no.  Wheeze - no, Crackles - no, No overt respiratory distress CVS: Normal heart sounds. Murmurs - no Ext: Stigmata of Connective Tissue Disease - no HEENT: Normal upper airway. PEERL +. No post nasal drip        Assessment:       ICD-10-CM   1. DOE (dyspnea on exertion)  R06.09     2. Wheeze  R06.2     3. Chronic cough  R05.3     4. Weight gain  R63.5          Plan:     Patient Instructions     ICD-10-CM   1. DOE (dyspnea on exertion)  R06.09     2. Wheeze  R06.2     3. Chronic cough  R05.3     4. Weight gain  R63.5       Possible asthma as a reason for your symptoms Possible loss of physical fitness and 15 pound weight gain is the reason for your symptoms  Plan -Do CBC with differential, RAST allergy panel - Do exhaled nitric  oxide test today - Do full pulmonary function test in the next few to several weeks  -Start empiric inhaler Breo 100 strength 1 puff once daily - Start empiric inhaler albuterol as needed  Follow-up - Return to see nurse practitioner in the next 4-8 weeks or Dr. Marchelle Gearing to discuss progress  -If no improvement in the next test would be a pulmonary stress test on an exercise bike with bronchospasm challenge   FOLLOWUP Return in about 6 weeks (around 03/13/2023) for 15 min visit, with any of the APPS, with Dr Marchelle Gearing, Face to Face OR Video Visit.    SIGNATURE    Dr. Kalman Shan, M.D., F.C.C.P,  Pulmonary and Critical Care Medicine Staff Physician, [facility] Director - Interstitial Lung Disease  Program  Pulmonary Fibrosis [facility] at [facility] [place], [place], [address]  Pager: [phone], If no answer or between  15:00h - 7:00h: call [phone] Telephone: [phone]  11:22 AM 01/30/2023

## 2023-01-30 NOTE — Patient Instructions (Addendum)
ICD-10-CM   1. DOE (dyspnea on exertion)  R06.09     2. Wheeze  R06.2     3. Chronic cough  R05.3     4. Weight gain  R63.5       Possible asthma as a reason for your symptoms Possible loss of physical fitness and 15 pound weight gain is the reason for your symptoms  Plan -Do CBC with differential, RAST allergy panel - Do exhaled nitric oxide test today - Do full pulmonary function test in the next few to several weeks  -Start empiric inhaler Breo 100 strength 1 puff once daily - Start empiric inhaler albuterol as needed  Follow-up - Return to see nurse practitioner in the next 4-8 weeks or Dr. Marchelle Gearing to discuss progress  -If no improvement in the next test would be a pulmonary stress test on an exercise bike with bronchospasm challenge

## 2023-02-01 LAB — RESPIRATORY ALLERGY PROFILE REGION II ~~LOC~~
Allergen, A. alternata, m6: <0.1 kU/L
Allergen, Cedar tree, t12: <0.1 kU/L
Allergen, Comm Silver Birch, t9: <0.1 kU/L
Allergen, Cottonwood, t14: <0.1 kU/L
Allergen, Mouse Urine Protein, e78: <0.1 kU/L
Allergen, Mulberry, t76: <0.1 kU/L
Allergen, Oak,t7: <0.1 kU/L
Allergen, P. notatum, m1: <0.1 kU/L
Aspergillus fumigatus, m3: <0.1 kU/L
Bermuda Grass: 0.23 kU/L — ABNORMAL HIGH
Box Elder IgE: <0.1 kU/L
CLADOSPORIUM HERBARUM (M2) IGE: <0.1 kU/L
COMMON RAGWEED (SHORT) (W1) IGE: <0.1 kU/L
Cat Dander: <0.1 kU/L
Class: 0
Class: 0
Class: 0
Class: 0
Class: 0
Class: 0
Class: 0
Class: 0
Class: 0
Class: 0
Class: 0
Class: 0
Class: 0
Class: 0
Class: 0
Class: 0
Class: 0
Class: 0
Class: 1
Class: 2
Class: 2
Class: 2
Cockroach: 0.19 kU/L — ABNORMAL HIGH
D. farinae: 1.05 kU/L — ABNORMAL HIGH
Dog Dander: <0.1 kU/L
Elm IgE: <0.1 kU/L
IgE (Immunoglobulin E), Serum: 100 kU/L (ref <=114)
IgE (Immunoglobulin E), Serum: 100 kU/L — ABNORMAL HIGH (ref <=114)
Johnson Grass: 0.59 kU/L — ABNORMAL HIGH
Pecan/Hickory Tree IgE: <0.1 kU/L
Rough Pigweed  IgE: <0.1 kU/L
Sheep Sorrel IgE: <0.1 kU/L
Timothy Grass: 2.36 kU/L — ABNORMAL HIGH

## 2023-02-01 LAB — INTERPRETATION:

## 2023-02-06 ENCOUNTER — Encounter: Payer: Self-pay | Admitting: Internal Medicine

## 2023-02-07 NOTE — Telephone Encounter (Signed)
Sorry [employer] plan is different.  She probably should look at her insurance plan to see which would be affordable.  Comparable inhalers would include Symbicort, Dulera, Wixela or Aspermont.  She can call insurance company as well.

## 2023-02-08 ENCOUNTER — Ambulatory Visit: Payer: Medicare Other | Admitting: Neurology

## 2023-02-08 VITALS — BP 152/82 | HR 79

## 2023-02-08 DIAGNOSIS — G243 Spasmodic torticollis: Secondary | ICD-10-CM | POA: Diagnosis not present

## 2023-02-08 DIAGNOSIS — M542 Cervicalgia: Secondary | ICD-10-CM | POA: Diagnosis not present

## 2023-02-08 DIAGNOSIS — G43709 Chronic migraine without aura, not intractable, without status migrainosus: Secondary | ICD-10-CM

## 2023-02-08 MED ORDER — ONABOTULINUMTOXINA 100 UNITS IJ SOLR
200.0000 [IU] | Freq: Once | INTRAMUSCULAR | Status: AC
Start: 2023-02-08 — End: 2023-02-08
  Administered 2023-02-08: 200 [IU] via INTRAMUSCULAR

## 2023-02-08 MED ORDER — ONABOTULINUMTOXINA 200 UNITS IJ SOLR
155.0000 [IU] | Freq: Once | INTRAMUSCULAR | Status: DC
Start: 2023-02-08 — End: 2023-02-08

## 2023-02-08 NOTE — Progress Notes (Signed)
Chief Complaint  Patient presents with   Procedure    Rm 15 alone Pt is well    ASSESSMENT AND PLAN  Adrienne Luna is a 77 y.o. female   Chronic migraine Abnormal neck posturing, neck pain  Prior authorization for Botox 200 units, responded very well to Botox injection every 3 months in the past, last injection was April 2022 at [facility] as needed EMg guided botulism toxin a injection, Right levator scapula 25 units Left levator scapula 25 units  Right iliocostalis 25 units Left iliocostalis 25 units Left splenius cervix 37.5 units Right splenius cervix 12.5 units  Left semispinalis 25 units Right semispinalis 25 units  DIAGNOSTIC DATA (LABS, IMAGING, TESTING) - I reviewed patient records, labs, notes, testing and imaging myself where available.   MEDICAL HISTORY:  Adrienne Luna, is a 77 year old female seen in request by Dr. Margaretann Loveless, Electa Sniff P, for evaluation of neck pain, headache, initial evaluation was on January 17, 2023    History is obtained from the patient and review of electronic medical records. I personally reviewed pertinent available imaging films in PACS.   PMHx of  HLD GERD  Patient reported many years history of chronic migraine headaches, usually starting at the neck, deep at the occipital region, as if she has broken glasses in her cervical region, crackling sound when she moves her neck, then spreading forwards, deep achy pain, sometimes pounding, sleeping dark quiet room was helpful  Over the years, she has managed by neck exercises, stretching, Fioricet as needed was helpful,  She was put on Botox injection as migraine prevention since 2020 at [facility], was very helpful, last injection was April 2022,  She recently moved from [place] to [place] in February 2022, missing her Botox injection, she began to have more frequent headaches, now 2-3 times each week, most headache starting at her neck, sometimes go  down her shoulder, or spreading forward to bilateral temporoparietal region, with associated light noise sensitivity, can lasting for hours or even days,  Laboratory evaluation from November 2023, normal CBC hemoglobin of 12.9, CMP creatinine of 0.86, ESR of 9,  I was also able to review previous evaluation from [place], [facility] pain management by Dr. 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 Eyes: Conjunctivae clear without exudates or hemorrhage Neck: Supple, no carotid bruits. Pulmonary: Clear to auscultation bilaterally   NEUROLOGICAL EXAM:  MENTAL STATUS: mild retrocollis, mild left shoulder elevation Speech/cognition: Awake, alert, oriented to history taking and casual conversation CRANIAL NERVES: CN II: Visual fields are full to confrontation. Pupils are round equal and  briskly reactive to light. CN III, IV, VI: extraocular movement are normal. No ptosis. CN V: Facial sensation is intact to light touch CN VII: Face is symmetric with normal eye closure  CN VIII: Hearing is normal to causal conversation. CN IX, X: Phonation is normal. CN XI: Head turning and shoulder shrug are intact  MOTOR: There is no pronator drift of out-stretched arms. Muscle bulk and tone are normal. Muscle  strength is normal.  REFLEXES: Reflexes are 2+ and symmetric at the biceps, triceps, knees, and ankles. Plantar responses are flexor.  SENSORY: Intact to light touch, pinprick and vibratory sensation are intact in fingers and toes.  COORDINATION: There is no trunk or limb dysmetria noted.  GAIT/STANCE: Posture is normal. Gait is steady with normal steps, base, arm swing, and turning. Heel and toe walking are normal. Tandem gait is normal.  Romberg is absent.  REVIEW OF SYSTEMS:  Full 14 system review of systems performed and notable only for as above All other review of systems were negative.   ALLERGIES: Allergies  Allergen Reactions   Codeine Nausea And Vomiting    Other Reaction(s): vomiting   Meperidine Hcl Nausea And Vomiting    Other Reaction(s): vomiting   Promethazine Other (See Comments)    Was given an overdose and it made her feel terrible per patient.   Sulfa Antibiotics Nausea And Vomiting and Other (See Comments)   Ciprofloxacin Other (See Comments)   Sulfamethoxazole     Other Reaction(s): other   Trimethoprim     Other Reaction(s): Other (See Comments)    HOME MEDICATIONS: Current Outpatient Medications  Medication Sig Dispense Refill   albuterol (VENTOLIN HFA) 108 (90 Base) MCG/ACT inhaler Inhale 2 puffs into the lungs every 6 (six) hours as needed for wheezing or shortness of breath. 8 g 2   aspirin 81 MG chewable tablet Chew 81 mg by mouth daily.     butalbital-acetaminophen-caffeine (FIORICET) 50-325-40 MG tablet Take 1 tablet by mouth every 6 (six) hours as needed for headache. 12 tablet 3   esomeprazole (NEXIUM) 10 MG packet Take 10 mg by mouth daily before breakfast.     estradiol (CLIMARA - DOSED IN MG/24 HR) 0.025 mg/24hr patch Place 0.025 mg onto the skin once a week.     fluticasone furoate-vilanterol (BREO ELLIPTA) 100-25 MCG/ACT AEPB Inhale 1 puff into the lungs daily. 30 each 11   polyethylene glycol (MIRALAX / GLYCOLAX) 17 g packet Take 17  g by mouth daily.     progesterone (PROMETRIUM) 200 MG capsule Take 200 mg by mouth daily.     rosuvastatin (CRESTOR) 10 MG tablet Take 10 mg by mouth daily.     Current Facility-Administered Medications  Medication Dose Route Frequency Provider Last Rate Last Admin   botulinum toxin Type A (BOTOX) injection 155 Units  155 Units Intramuscular Once Levert Feinstein, MD        PAST MEDICAL HISTORY: Past Medical History:  Diagnosis Date   IBS (irritable bowel syndrome)     PAST SURGICAL HISTORY: Past Surgical History:  Procedure Laterality Date   ABDOMINAL HYSTERECTOMY     CESAREAN SECTION     x2    FAMILY HISTORY: Family History  Problem Relation Age of Onset   Breast cancer Mother    Melanoma Mother    Lung cancer Father    Emphysema Father    COPD Brother    Lung cancer Brother    Lung cancer Brother     SOCIAL HISTORY: Social  History   Socioeconomic History   Marital status: Married    Spouse name: charlie   Number of children: 4   Years of education: Not on file   Highest education level: Associate degree: occupational, Scientist, product/process development, or vocational program  Occupational History   Not on file  Tobacco Use   Smoking status: Never    Passive exposure: Past   Smokeless tobacco: Never  Vaping Use   Vaping status: Never Used  Substance and Sexual Activity   Alcohol use: Yes    Alcohol/week: 7.0 standard drinks of alcohol    Types: 7 Glasses of wine per week   Drug use: Not Currently   Sexual activity: Not on file  Other Topics Concern   Not on file  Social History Narrative   Not on file   Social Determinants of Health   Financial Resource Strain: Not on file  Food Insecurity: Not on file  Transportation Needs: Not on file  Physical Activity: Not on file  Stress: Not on file  Social Connections: Unknown (07/17/2021)   Received from [facility]   Social Network    Social Network: Not on file  Intimate Partner Violence: Unknown (06/18/2021)    Received from [facility], [facility]   HITS    Physically Hurt: Not on file    Insult or Talk Down To: Not on file    Threaten Physical Harm: Not on file    Scream or Curse: Not on file      Levert Feinstein, M.D. Ph.D.  [facility] [address] [place], [place] [address] Ph: [phone] Fax: [phone]  CC:  Thana Ates, MD [address] [place],  [place] [address]  Thana Ates, MD

## 2023-02-08 NOTE — Progress Notes (Signed)
Botox- 200 units x 1 vial Lot: [id number] Expiration: 09/26 NDC: 3086-5784-69  Bacteriostatic 0.9% Sodium Chloride- 4mL  Lot: [id number] Expiration: 06/06/23 NDC: 4132440102  Dx: : G43.709, G24.3  B/B Witnessed by DIAMOND CMA

## 2023-02-09 ENCOUNTER — Encounter: Payer: Self-pay | Admitting: Neurology

## 2023-02-09 ENCOUNTER — Telehealth: Payer: Self-pay | Admitting: Neurology

## 2023-02-09 DIAGNOSIS — G243 Spasmodic torticollis: Secondary | ICD-10-CM

## 2023-02-09 DIAGNOSIS — G43709 Chronic migraine without aura, not intractable, without status migrainosus: Secondary | ICD-10-CM

## 2023-02-09 DIAGNOSIS — M1990 Unspecified osteoarthritis, unspecified site: Secondary | ICD-10-CM | POA: Insufficient documentation

## 2023-02-09 MED ORDER — CELECOXIB 100 MG PO CAPS: 100.0000 mg | ORAL_CAPSULE | Freq: Two times a day (BID) | ORAL | 3 refills | Status: DC | PRN

## 2023-02-09 NOTE — Telephone Encounter (Signed)
Patient received  EMg guided botulism toxin a injection on February 08, 2023, Right levator scapula 25 units Left levator scapula 25 units   Right iliocostalis 25 units Left iliocostalis 25 units Left splenius cervix 37.5 units Right splenius cervix 12.5 units   Left semispinalis 25 units Right semispinalis 25 units  Called office today, complains of left upper cervical and levator scapular area pain, examination showed tight muscle, tenderness upon deep palpitation, no palpable abnormality otherwise  She has migraine  today, respond to Fioricet, Celebrex as needed for neck pain, also suggested warm compression, neck stretching,  Meds ordered this encounter  Medications   celecoxib (CELEBREX) 100 MG capsule    Sig: Take 1 capsule (100 mg total) by mouth 2 (two) times daily as needed.    Dispense:  60 capsule    Refill:  3

## 2023-02-16 ENCOUNTER — Other Ambulatory Visit: Payer: Self-pay | Admitting: Medical Genetics

## 2023-02-18 ENCOUNTER — Encounter: Payer: Self-pay | Admitting: Neurology

## 2023-02-20 ENCOUNTER — Telehealth: Payer: Self-pay | Admitting: Neurology

## 2023-02-20 ENCOUNTER — Telehealth: Payer: Self-pay

## 2023-02-20 DIAGNOSIS — G243 Spasmodic torticollis: Secondary | ICD-10-CM

## 2023-02-20 DIAGNOSIS — G43709 Chronic migraine without aura, not intractable, without status migrainosus: Secondary | ICD-10-CM

## 2023-02-20 DIAGNOSIS — M542 Cervicalgia: Secondary | ICD-10-CM

## 2023-02-20 MED ORDER — GABAPENTIN 300 MG PO CAPS: 300.0000 mg | ORAL_CAPSULE | Freq: Three times a day (TID) | ORAL | 5 refills | Status: DC | PRN

## 2023-02-20 NOTE — Telephone Encounter (Signed)
Returned call to patient who states she is still in extreme pain from last injection. She states she is now wearing a neck brace due to the pain that radiates up left side of neck and left side of face and makes her lip quiver. She has not picked up the Celebrex due to cost and is having to come home from her vacation due to the pain. The Fioricet has not helped. Advised I would send to Dr. Terrace Arabia for review and recommendations. Patient appreciative of call

## 2023-02-20 NOTE — Telephone Encounter (Signed)
medicare/[employer] sup NPR sent to GI [phone] 

## 2023-02-20 NOTE — Telephone Encounter (Signed)
I saw patient, she continues to complain of significant neck pain following her EMg guided botulism toxin a injection on February 08, 2023, Right levator scapula 25 units Left levator scapula 25 units   Right iliocostalis 25 units Left iliocostalis 25 units Left splenius cervix 37.5 units Right splenius cervix 12.5 units   Left semispinalis 25 units Right semispinalis 25 units   Her neck pain is mainly on the left side, radiating to left occipital, parietal region, she has been using neck brace, Tylenol with Advil with some help  Discussed with patient, she agreed further evaluation, proceeding with MRI of cervical spine with without contrast  She complains of high price with Celebrex more than $200, suboptimal response to the medications in the past  Decided to gabapentin up to 300 mg 3 times a day

## 2023-02-20 NOTE — Addendum Note (Signed)
Addended by: Levert Feinstein on: 02/20/2023 02:21 PM   Modules accepted: Orders

## 2023-02-23 ENCOUNTER — Encounter: Payer: Self-pay | Admitting: Neurology

## 2023-02-23 DIAGNOSIS — G243 Spasmodic torticollis: Secondary | ICD-10-CM

## 2023-02-23 DIAGNOSIS — G43709 Chronic migraine without aura, not intractable, without status migrainosus: Secondary | ICD-10-CM

## 2023-02-23 DIAGNOSIS — M542 Cervicalgia: Secondary | ICD-10-CM

## 2023-02-27 ENCOUNTER — Telehealth: Payer: Self-pay

## 2023-02-27 MED ORDER — CYCLOBENZAPRINE HCL 5 MG PO TABS: 5.0000 mg | ORAL_TABLET | Freq: Every day | ORAL | 0 refills | Status: AC

## 2023-02-27 NOTE — Addendum Note (Signed)
Addended by: Melvyn Novas on: 02/27/2023 05:23 PM   Modules accepted: Orders

## 2023-02-27 NOTE — Telephone Encounter (Signed)
Pt returning call. Requesting call back.  

## 2023-02-27 NOTE — Telephone Encounter (Signed)
Call to patient, no answer. Left message to return call.

## 2023-02-27 NOTE — Telephone Encounter (Signed)
Patient responded via mychart msg closing out this encounter

## 2023-03-09 NOTE — Addendum Note (Signed)
 Addended by: Levert Feinstein on: 03/09/2023 03:13 PM   Modules accepted: Orders

## 2023-03-09 NOTE — Telephone Encounter (Signed)
 I called patient, she complains of constant neck pain, headache, difficulty sleeping, dizziness when getting up  Desired to have MRI of the brain, order was placed, is on schedule for MRI cervical spine, which was ordered due to her complaints of severe neck pain following Botox  injection,  She was also given steroid tapering by sports medicine physician with limited help,  Advised her as needed NSAIDs, warm compression,  Orders Placed This Encounter  Procedures   MR BRAIN WO CONTRAST      Please see the MyChart message reply(ies) for my assessment and plan.    This patient gave consent for this Medical Advice Message and is aware that it may result in a bill to [employer], as well as the possibility of receiving a bill for a co-payment or deductible. They are an established patient, but are not seeking medical advice exclusively about a problem treated during an in person or video visit in the last seven days. I did not recommend an in person or video visit within seven days of my reply.    I spent a total of 10 minutes cumulative time within 7 days through [employer].  Modena Callander, MD

## 2023-03-10 ENCOUNTER — Ambulatory Visit (HOSPITAL_BASED_OUTPATIENT_CLINIC_OR_DEPARTMENT_OTHER): Payer: Medicare Other | Admitting: Orthopaedic Surgery

## 2023-03-10 ENCOUNTER — Ambulatory Visit
Admission: RE | Admit: 2023-03-10 | Discharge: 2023-03-10 | Disposition: A | Discharge status: Home / Self Care | Payer: Medicare Other | Source: Ambulatory Visit | Attending: Neurology | Admitting: Neurology

## 2023-03-10 DIAGNOSIS — G43709 Chronic migraine without aura, not intractable, without status migrainosus: Secondary | ICD-10-CM

## 2023-03-10 DIAGNOSIS — G243 Spasmodic torticollis: Secondary | ICD-10-CM | POA: Diagnosis not present

## 2023-03-10 DIAGNOSIS — M542 Cervicalgia: Secondary | ICD-10-CM | POA: Diagnosis not present

## 2023-03-10 MED ORDER — GADOPICLENOL 0.5 MMOL/ML IV SOLN
7.0000 mL | Freq: Once | INTRAVENOUS | Status: AC | PRN
Start: 2023-03-10 — End: 2023-03-10
  Administered 2023-03-10: 7 mL via INTRAVENOUS

## 2023-03-13 ENCOUNTER — Telehealth: Payer: Self-pay | Admitting: Neurology

## 2023-03-13 NOTE — Telephone Encounter (Signed)
 Contacted pt, informed her MRI of cervical spine showed multilevel degenerative changes, there is no evidence of spinal cord or nerve root compression. MD will review MRI cervical spine in detail at her next follow-up. She verbally understood was appreciative.  Patient stated she will also review MRI of brain with Dr. At next appointment as well.

## 2023-03-13 NOTE — Telephone Encounter (Signed)
 Please call patient, MRI of cervical spine showed multilevel degenerative changes, there is no evidence of spinal cord or nerve root compression,   I will review MRI cervical spine in detail at her next follow-up visit   IMPRESSION: This MRI of cervical spine with and without contrast shows the following: The spinal cord appears normal.   Multilevel degenerative changes from C2-T1 as detailed above.  Although the central canal is mildly narrowed at several levels, there is no spinal stenosis.  Multilevel foraminal narrowing but no nerve root compression. At C3-C4, there is severe loss of disc height and ankylosis of the left facet joint associated with mild facet hypertrophy.  There is foraminal narrowing but no spinal stenosis or nerve root compression.

## 2023-03-22 ENCOUNTER — Other Ambulatory Visit: Payer: Medicare Other

## 2023-03-22 ENCOUNTER — Ambulatory Visit
Admission: RE | Admit: 2023-03-22 | Discharge: 2023-03-22 | Disposition: A | Discharge status: Home / Self Care | Payer: Medicare Other | Source: Ambulatory Visit | Attending: Neurology | Admitting: Neurology

## 2023-03-22 DIAGNOSIS — G43709 Chronic migraine without aura, not intractable, without status migrainosus: Secondary | ICD-10-CM | POA: Diagnosis not present

## 2023-03-22 DIAGNOSIS — M542 Cervicalgia: Secondary | ICD-10-CM

## 2023-03-22 DIAGNOSIS — G243 Spasmodic torticollis: Secondary | ICD-10-CM

## 2023-03-27 ENCOUNTER — Encounter: Payer: Self-pay | Admitting: Neurology

## 2023-03-27 DIAGNOSIS — M542 Cervicalgia: Secondary | ICD-10-CM

## 2023-03-27 DIAGNOSIS — G243 Spasmodic torticollis: Secondary | ICD-10-CM

## 2023-03-27 DIAGNOSIS — G43709 Chronic migraine without aura, not intractable, without status migrainosus: Secondary | ICD-10-CM

## 2023-03-28 ENCOUNTER — Telehealth: Payer: Self-pay | Admitting: Neurology

## 2023-03-28 ENCOUNTER — Telehealth: Payer: Self-pay

## 2023-03-28 NOTE — Telephone Encounter (Signed)
I called patient, she complains of constant neck pain, radiating from lower cervical to occipital region, bilaterally, difficulty sleeping, finding a comfortable position  Discussed with patient, decided to Refer to physical therapy Pain management at [facility] Suggested neck stretching, heating pad, As needed NSAIDs, muscle relaxant,    She has follow-up visit on May 03, 2023, please do not dissolve botulism toxin until I finished talking with her

## 2023-03-28 NOTE — Telephone Encounter (Signed)
Note made for appointment

## 2023-03-28 NOTE — Telephone Encounter (Signed)
Referral for pain clinic fax to [facility]. Phone: [phone], Fax: [phone]. 

## 2023-03-28 NOTE — Addendum Note (Signed)
Addended by: Levert Feinstein on: 03/28/2023 11:47 AM   Modules accepted: Orders

## 2023-03-28 NOTE — Telephone Encounter (Signed)
Noted  

## 2023-03-28 NOTE — Telephone Encounter (Signed)
 Referral for physical  therapy sent through [facility] to [facility]. Phone: [phone]

## 2023-03-29 ENCOUNTER — Other Ambulatory Visit: Payer: Medicare Other

## 2023-04-10 ENCOUNTER — Encounter: Payer: Self-pay | Admitting: Physical Therapy

## 2023-04-10 ENCOUNTER — Other Ambulatory Visit: Payer: Self-pay

## 2023-04-10 ENCOUNTER — Ambulatory Visit: Payer: Medicare Other | Attending: Neurology | Admitting: Physical Therapy

## 2023-04-10 DIAGNOSIS — M542 Cervicalgia: Secondary | ICD-10-CM | POA: Insufficient documentation

## 2023-04-10 DIAGNOSIS — R42 Dizziness and giddiness: Secondary | ICD-10-CM | POA: Diagnosis present

## 2023-04-10 DIAGNOSIS — G243 Spasmodic torticollis: Secondary | ICD-10-CM | POA: Insufficient documentation

## 2023-04-10 DIAGNOSIS — G43709 Chronic migraine without aura, not intractable, without status migrainosus: Secondary | ICD-10-CM | POA: Insufficient documentation

## 2023-04-10 NOTE — Therapy (Unsigned)
OUTPATIENT PHYSICAL THERAPY VESTIBULAR EVALUATION     Patient Name: Adrienne Luna MRN: [id number] DOB:Jul 19, 1945, 78 y.o., female Today's Date: 04/11/2023  END OF SESSION:  PT End of Session - 04/10/23 1229     Visit Number 1    Number of Visits 12    Date for PT Re-Evaluation 05/19/23    Authorization Type Medicare/[employer]    Progress Note Due on Visit 10    PT Start Time 1230    PT Stop Time 1320    PT Time Calculation (min) 50 min    Activity Tolerance Patient tolerated treatment well;Patient limited by pain    Behavior During Therapy [facility] for tasks assessed/performed             Past Medical History:  Diagnosis Date   IBS (irritable bowel syndrome)    Past Surgical History:  Procedure Laterality Date   ABDOMINAL HYSTERECTOMY     CESAREAN SECTION     x2   Patient Active Problem List   Diagnosis Date Noted   Osteoarthritis 02/09/2023   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   Neck pain 01/17/2023   Diverticulitis of intestine, part unspecified, without perforation or abscess without bleeding 04/28/2020   Unspecified menopausal and perimenopausal disorder 12/14/2016   Pain in right shoulder 12/14/2016   Other specified disorders of bone density and structure, unspecified site 12/14/2016   Insomnia, unspecified 12/14/2016   Hyperlipidemia, unspecified 12/14/2016   History of colonic polyps 12/14/2016   Gastro-esophageal reflux disease without esophagitis 12/14/2016   Essential (primary) hypertension 12/14/2016   Vitamin D deficiency, unspecified 12/14/2016   Chronic migraine without aura without status migrainosus, not intractable 12/16/2014   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  When I had the Botox injection with Dr. Terrace Arabia on December 4th, I had never had pain that way and pain shot into my head and scalp.  The pain has persisted and I can't bend over because I get dizzy and get a rush of pain into my head.  Almost makes me sick.  Turning over either way causes a "rush" into my head. Pt accompanied by: self  PERTINENT HISTORY: hx cervical dystonia, torticollis: pt stated that she has occipita neuralgia, previous botox for cervical dystonia that helped ; HLD, GERD, chronic migraines  PAIN:  Are you having pain? Yes: NPRS scale: up to 10/10; 8/10 with medication Pain location: L neck and into head Pain description: head rush; sore to touch Aggravating factors: bending head and bending over, turning quickly Relieving factors: butalbital medication helps  PRECAUTIONS: Fall and Other: dizziness and quick motions produce dizziness and pain  RED FLAGS: None   WEIGHT BEARING RESTRICTIONS: No  FALLS: Has patient fallen in last 6 months? No  LIVING ENVIRONMENT: Lives with: lives with their family Lives in: House/apartment Has following equipment at home:  uses cane at times  PLOF: Independent  PATIENT GOALS: To get back to normal-get rid of dizziness and pain  OBJECTIVE:  Note: Objective  measures were completed at Evaluation unless otherwise noted.  DIAGNOSTIC FINDINGS: This MRI of cervical spine with and without contrast shows the following: The spinal cord appears normal.   Multilevel degenerative changes from C2-T1 as detailed above.  Although the central canal is mildly narrowed at several levels, there is no spinal stenosis.  Multilevel foraminal narrowing but no nerve root  compression. At C3-C4, there is severe loss of disc height and ankylosis of the left facet joint associated with mild facet hypertrophy.  There is foraminal narrowing but no spinal stenosis or nerve root compression.  Unremarkable MRI brain . No acute findings   COGNITION: Overall cognitive status: Within functional limits for tasks assessed   SENSATION: Does report hx of carpal tunnel; so some tingling; occasional numbness into L arm and hand  POSTURE:  rounded shoulders, forward head, and   slight tilt towards R side  Cervical ROM:    Active A/PROM (deg) eval  Flexion 18 degrees, tightness  Extension 8 degrees pain  Right lateral flexion   Left lateral flexion   Right rotation 49  Left rotation 35 pain  (Blank rows = not tested)  TRANSFERS: Assistive device utilized: None  Sit to stand: Complete Independence Stand to sit: Complete Independence  GAIT: Gait pattern: WFL  PATIENT SURVEYS:  DHI Score 72 (>54 points indicates severe handicap)  PALPATION: Tenderness to palpation L UT and levator  VESTIBULAR ASSESSMENT:  GENERAL OBSERVATION: Pt in no acute distress, but posture is somewhat guarded due to reports of dizziness   SYMPTOM BEHAVIOR: Subjective history: Have occipital neuralgia, for which I got periodic injections (Botox every 4-5 months) when I lived in [place].  When I had the Botox injection with Dr. Terrace Arabia on December 4th, I had never had pain that way and pain shot into my head and scalp.  The pain has persisted and I can't bend over because I get dizzy and get a rush of pain into my head.  Almost makes me sick.  Turning over either way causes a "rush" into my head. Reports sensation a shooting pain>dizziness  Non-Vestibular symptoms: diplopia, neck pain, headaches, and migraine symptoms  Pt reports diplopia with L head turn to end range  Type of dizziness: Imbalance (Disequilibrium), Unsteady with head/body turns, and fogginess  Frequency: multiple  times/day  Duration: few minutes  Aggravating factors: Induced by motion: bending down to the ground, turning body quickly, and turning head quickly  Relieving factors: closing eyes and slow movements  Progression of symptoms: unchanged  OCULOMOTOR EXAM: wears bifocals/progressives  Ocular Alignment: normal  Ocular ROM: No Limitations  Spontaneous Nystagmus: absent  Gaze-Induced Nystagmus: absent  Smooth Pursuits: intact  Saccades: intact  Convergence/Divergence: NT    VESTIBULAR - OCULAR REFLEX:   Slow VOR: Normal and Comment: no dizziness, but pain in L neck  VOR Cancellation: Normal  Head-Impulse Test: HIT Right: negative HIT Left: negative  Dynamic Visual Acuity:  NT  *Pt has history of multiple eye surgeries on L eye (from an injury years ago)- with some motions above, pt's L eye has very slowed abduction movements  POSITIONAL TESTING: Did not test, due to pt not c/o positional vertigo symptoms  MOTION SENSITIVITY:  Motion Sensitivity Quotient Intensity: 0 = none, 1 = Lightheaded, 2 = Mild, 3 = Moderate, 4 = Severe, 5 = Vomiting  Intensity  1. Sitting to supine   2. Supine to L side   3. Supine to R side   4. Supine to sitting   5.  L Hallpike-Dix   6. Up from L    7. R Hallpike-Dix   8. Up from R    9. Sitting, head tipped to L knee   10. Head up from L knee   11. Sitting, head tipped to R knee   12. Head up from R knee   13. Sitting head turns x5   14.Sitting head nods x5   15. In stance, 180 turn to L    16. In stance, 180 turn to R                                                                                                                                  TREATMENT DATE: 04/10/2023   Other: Initiated gentle stretching neck-attempted neck retraction/extension and cervical rotation with towel; pt has discomfort Performed seated chin tuck/neck retraction, A/ROM, with no c/o increased pain (provided as HEP)  PATIENT EDUCATION: Education details: Eval  results, POC, HEP initiated Person educated: Patient Education method: Explanation, Demonstration, Verbal cues, and Handouts Education comprehension: verbalized understanding and returned demonstration  HOME EXERCISE PROGRAM: Access Code: [email or web address] URL: [email or web address] Date: 04/11/2023 Prepared by: [facility] - Outpatient  Rehab - [facility]  Exercises - Seated Cervical Retraction  - 1-2 x daily - 7 x weekly - 2 sets - 5-10 reps - 3 sec hold  GOALS: Goals reviewed with patient? Yes  SHORT TERM GOALS: Target date: 04/21/2023  Pt will be independent with HEP for improved pain and dizziness. Baseline: Goal status: INITIAL  LONG TERM GOALS: Target date: 05/19/2023  Pt will be independent with progression of HEP for improved pain and dizziness. Baseline:  Goal status: INITIAL  2.  Pt will report pain decreased by at least 50% for improved participation in daily activities. Baseline: 8-10/10 Goal status: INITIAL  3.  Pt will improve DHI score to less than or equal to 54, to demo decreased limitations by dizziness in daily activities. Baseline:  Goal status: INITIAL  4.  FGA score to be assessed, with score to improve to at least 22/30 for decreased fall risk/improved functional mobility. Baseline: TBA Goal status: INITIAL   ASSESSMENT:  CLINICAL IMPRESSION: Patient is a 78 y.o. female who was seen today for physical therapy evaluation and treatment for neck pain and dizziness.  She has a history of occipital neuralgia, that has been managed with Botox in [place] over the past few years.  Upon moving to [facility], she sought out MD to proceed with pain management of occipital neuralgia through Botox here.  She had Botox shot on 02/08/23, and noted significant pain during the shots and after, which she has never experienced before.  She notes continued pain, as well as dizziness with quick head motions and bending over.  She does present with guarded posture, en  bloc movement patterns, decreased neck flexibility, increased pain, reports of dizziness.  She Oculomotor and VOR testing are WFL, with exception of some slowed  L eye movements, that pt reports is a long standing issue due to hx of eye surgeries as a child.  She also has had R eye issues with levator muscle of R eye following cataract surgery.  She does have hx of migraines. She does not report dizziness with positional movements, so positional testing for BPPV not completed.  She is tender to palpation along L neck musculature.  She will benefit from skilled PT to address the above stated deficits to decrease pain and dizziness, to improve overall functional mobility and participation in household and social activities.   OBJECTIVE IMPAIRMENTS: Abnormal gait, decreased ROM, dizziness, impaired flexibility, postural dysfunction, and pain.   ACTIVITY LIMITATIONS: bending, standing, squatting, reach over head, locomotion level, and caring for others  PARTICIPATION LIMITATIONS: meal prep, cleaning, laundry, driving, shopping, and community activity  PERSONAL FACTORS: 3+ comorbidities: see PMH  are also affecting patient's functional outcome.   REHAB POTENTIAL: Good  CLINICAL DECISION MAKING: Stable/uncomplicated  EVALUATION COMPLEXITY: Low   PLAN:  PT FREQUENCY: 2x/week  PT DURATION: 6 weeks  PLANNED INTERVENTIONS: 97110-Therapeutic exercises, 97530- Therapeutic activity, 97112- Neuromuscular re-education, 97535- Self Care, 16109- Manual therapy, Patient/Family education, Balance training, Dry Needling, Vestibular training, and Moist heat  PLAN FOR NEXT SESSION: Check motion sensitivity quotient, MCTSIB, FGA; progress gentle neck stretches, STM to L shoulder/neck muscles; possibility of DN for pain in conjunction with stretching?   Gean Maidens., PT 04/11/2023, 7:54 AM   [facility] at [facility] [address] [place], [address] [place], [place] [address] Phone #  [phone] Fax # [phone]

## 2023-04-11 ENCOUNTER — Other Ambulatory Visit: Payer: Self-pay | Admitting: Primary Care

## 2023-04-11 ENCOUNTER — Encounter: Payer: Self-pay | Admitting: Primary Care

## 2023-04-11 ENCOUNTER — Ambulatory Visit: Payer: Medicare Other | Admitting: Internal Medicine

## 2023-04-11 ENCOUNTER — Ambulatory Visit (INDEPENDENT_AMBULATORY_CARE_PROVIDER_SITE_OTHER): Payer: Medicare Other | Admitting: Primary Care

## 2023-04-11 VITALS — BP 119/76 | HR 70 | Temp 96.6°F | Ht 63.5 in | Wt 180.0 lb

## 2023-04-11 DIAGNOSIS — R062 Wheezing: Secondary | ICD-10-CM

## 2023-04-11 DIAGNOSIS — J3089 Other allergic rhinitis: Secondary | ICD-10-CM

## 2023-04-11 DIAGNOSIS — R0609 Other forms of dyspnea: Secondary | ICD-10-CM

## 2023-04-11 DIAGNOSIS — R053 Chronic cough: Secondary | ICD-10-CM

## 2023-04-11 LAB — PULMONARY FUNCTION TEST
DL/VA % pred: 123 %
DL/VA: 5.09 ml/min/mmHg/L
DLCO cor % pred: 115 %
DLCO cor: 21.62 ml/min/mmHg
DLCO unc % pred: 115 %
DLCO unc: 21.62 ml/min/mmHg
FEF 25-75 Post: 2.44 L/s
FEF 25-75 Pre: 1.76 L/s
FEF2575-%Change-Post: 38 %
FEF2575-%Pred-Post: 159 %
FEF2575-%Pred-Pre: 114 %
FEV1-%Change-Post: 8 %
FEV1-%Pred-Post: 109 %
FEV1-%Pred-Pre: 101 %
FEV1-Post: 2.18 L
FEV1-Pre: 2.02 L
FEV1FVC-%Change-Post: 2 %
FEV1FVC-%Pred-Pre: 104 %
FEV6-%Change-Post: 5 %
FEV6-%Pred-Post: 108 %
FEV6-%Pred-Pre: 102 %
FEV6-Post: 2.75 L
FEV6-Pre: 2.6 L
FEV6FVC-%Pred-Post: 105 %
FEV6FVC-%Pred-Pre: 105 %
FVC-%Change-Post: 5 %
FVC-%Pred-Post: 102 %
FVC-%Pred-Pre: 97 %
FVC-Post: 2.75 L
FVC-Pre: 2.6 L
Post FEV1/FVC ratio: 80 %
Post FEV6/FVC ratio: 100 %
Pre FEV1/FVC ratio: 78 %
Pre FEV6/FVC Ratio: 100 %
RV % pred: 96 %
RV: 2.22 L
TLC % pred: 97 %
TLC: 4.84 L

## 2023-04-11 MED ORDER — FLUTICASONE-SALMETEROL 115-21 MCG/ACT IN AERO: 2.0000 | INHALATION_SPRAY | Freq: Two times a day (BID) | RESPIRATORY_TRACT | 2 refills | Status: DC

## 2023-04-11 MED ORDER — LORATADINE 10 MG PO TABS: 10.0000 mg | ORAL_TABLET | Freq: Every day | ORAL | 2 refills | Status: AC

## 2023-04-11 MED ORDER — IPRATROPIUM BROMIDE 0.03 % NA SOLN: 2.0000 | Freq: Two times a day (BID) | NASAL | 2 refills | Status: AC

## 2023-04-11 NOTE — Patient Instructions (Addendum)
 -  DYSPNEA ON EXERTION: Dyspnea on exertion means experiencing shortness of breath during physical activities. Your lung function tests are normal, but given your family history and symptoms, we suspect mild intermittent asthma. We will start a trial of the Advair inhaler to see if it helps. If the cost is an issue, please message us  for alternative options.  -ALLERGIC RHINITIS: Allergic rhinitis is an allergic reaction that causes sneezing, congestion, and a runny nose. Your allergy  tests were positive for dust mites and grasses, which may be contributing to your cough. We recommend starting Claritin  (loratadine ) daily and using Atrovent  (ipratropium bromide ) nasal spray to help manage your symptoms.  Recommendations: Start Claritin  10mg  daily  Atrovent  nasal spray- use 1 spray per nostril twice daily  Advair HFA take two puffs every 12 hours (rinse mouth after use)  Follow-up  6-8 weeks with Beth NP or MR

## 2023-04-11 NOTE — Progress Notes (Signed)
 @Patient  ID: Adrienne Luna, female    DOB: 10/08/1945, 78 y.o.   MRN: [id number]  No chief complaint on file.   Referring provider: Dwight Trula SQUIBB, MD  HPI: 78 year old female, never smoked.  Past medical history significant for hypertension, temporal artery aneurysm, GERD, hyperlipidemia, insomnia, vitamin D deficiency.  04/11/2023 Discussed the use of AI scribe software for clinical note transcription with the patient, who gave verbal consent to proceed.  Patient presents today for follow-up with pulmonary function testing.  She was seen by Dr. Geronimo in November as consult due to dyspnea on exertion.  Symptoms felt likely to be related to asthma or deconditioning.  Patient was started on empiric inhaler Breo 100 mcg 1 puff daily and albuterol  as needed.  She is class II allergy  to dust mites and several grasses.  Fractionated nitric oxide  test was normal at 16.  She has been experiencing a persistent cough and shortness of breath, particularly when climbing stairs, which leaves her feeling winded. Her breathing is described as 'shallow' and 'an effort,' with panting after exertion. These symptoms have been present since at least November, when she underwent a fractionated nitric oxide  test, which was normal. Recent breathing tests showed normal lung function with a total lung capacity of 97% and a diffusion capacity of 115%.  She has been prescribed two inhalers, including albuterol  and Breo, but was unable to afford Breo due to its high cost. She has not used an inhaler before and is unsure if she is using it correctly. She experiences an 'underlying wheezing' that is more pronounced at night and feels like it is in her throat, which she finds aggravating.  Allergy  testing revealed positive results for dust mites and several grasses. She has been using Flonase nasal spray occasionally and has not been taking any antihistamines. She mentions postnasal drip and cough, which may be  contributing to her respiratory symptoms.  Her family history is significant for asthma and eczema in her brothers, two of whom died from lung cancer after smoking. She has never smoked and is currently overweight. 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 She also has a meniscus tear in her left knee, which limits her ability to walk and exercise. She mentions that her husband is on multiple medications, which are costly, impacting her ability to afford her medications.      Pulmonary function testing 04/11/2023 -FVC 2.75 (102%), FEV1 2.18 (109%), ratio 80, TLC 97%, DLCOunc 21.62 (115%) Normal spirometry without bronchodilator response   Allergies  Allergen Reactions   Codeine Nausea And Vomiting    Other Reaction(s): vomiting   Meperidine Hcl Nausea And Vomiting    Other Reaction(s): vomiting   Promethazine Other (See Comments)    Was given an overdose and it made her feel terrible per patient.   Sulfa Antibiotics Nausea And Vomiting and Other (See Comments)   Ciprofloxacin Other (See Comments)   Sulfamethoxazole     Other Reaction(s): other   Trimethoprim     Other Reaction(s): Other (See Comments)     There is no immunization history on file for this patient.  Past Medical History:  Diagnosis Date   IBS (irritable bowel syndrome)     Tobacco History: Social History   Tobacco Use  Smoking Status Never   Passive exposure: Past  Smokeless Tobacco Never   Counseling given: Not Answered   Outpatient Medications Prior to Visit  Medication Sig Dispense Refill   albuterol  (VENTOLIN  HFA) 108 (90 Base) MCG/ACT inhaler Inhale 2 puffs into the lungs every 6 (six) hours as needed for wheezing or shortness of breath. 8 g 2   aspirin 81 MG chewable tablet Chew 81 mg by mouth daily.     butalbital -acetaminophen-caffeine  (FIORICET) 50-325-40 MG tablet Take 1 tablet by mouth every 6 (six) hours as  needed for headache. 12 tablet 3   celecoxib  (CELEBREX ) 100 MG capsule Take 1 capsule (100 mg total) by mouth 2 (two) times daily as needed. 60 capsule 3   cyclobenzaprine  (FLEXERIL ) 5 MG tablet Take 1 tablet (5 mg total) by mouth at bedtime. 30 tablet 0   esomeprazole (NEXIUM) 10 MG packet Take 10 mg by mouth daily before breakfast.     estradiol (CLIMARA - DOSED IN MG/24 HR) 0.025 mg/24hr patch Place 0.025 mg onto the skin once a week.     fluticasone  furoate-vilanterol (BREO ELLIPTA ) 100-25 MCG/ACT AEPB Inhale 1 puff into the lungs daily. (Patient not taking: Reported on 04/10/2023) 30 each 11   gabapentin  (NEURONTIN ) 300 MG capsule Take 1 capsule (300 mg total) by mouth 3 (three) times daily as needed. 90 capsule 5   polyethylene glycol (MIRALAX / GLYCOLAX) 17 g packet Take 17 g by mouth daily.     progesterone (PROMETRIUM) 200 MG capsule Take 200 mg by mouth daily.     rosuvastatin (CRESTOR) 10 MG tablet Take 10 mg by mouth daily.     No facility-administered medications prior to visit.      Review of Systems  Review of Systems  Constitutional: Negative.   HENT: Negative.    Respiratory:  Positive for cough.        DOE  Cardiovascular: Negative.    Physical Exam  There were no vitals taken for this visit. Physical Exam Constitutional:      General: She is not in acute distress.    Appearance: Normal appearance. She is not ill-appearing.  HENT:     Head: Normocephalic and atraumatic.  Cardiovascular:     Rate and Rhythm: Normal rate and regular rhythm.  Pulmonary:     Effort: Pulmonary effort is normal.     Breath sounds: Normal breath sounds.  Musculoskeletal:        General: Normal range of motion.  Skin:    General: Skin is warm and dry.  Neurological:     General: No focal deficit present.     Mental Status: She is alert and oriented to person, place, and time. Mental status is at baseline.  Psychiatric:        Mood and Affect: Mood normal.        Behavior:  Behavior normal.        Thought Content: Thought content normal.        Judgment: Judgment normal.      Lab Results:  CBC    Component Value Date/Time   WBC 5.4 01/30/2023 1137   RBC 4.05 01/30/2023 1137   HGB 13.6 01/30/2023 1137   HCT 41.2 01/30/2023 1137   PLT 269.0 01/30/2023 1137   MCV 101.8 (H) 01/30/2023 1137   MCH 33.4 10/18/2022 1706   MCHC 32.9 01/30/2023 1137   RDW 13.6 01/30/2023 1137   LYMPHSABS 0.7 01/30/2023 1137   MONOABS 0.5 01/30/2023 1137   EOSABS 0.1 01/30/2023 1137   BASOSABS 0.1 01/30/2023 1137    BMET    Component Value Date/Time   NA 141 10/18/2022 1706  K 4.0 10/18/2022 1706   CL 106 10/18/2022 1706   CO2 25 10/18/2022 1706   GLUCOSE 107 (H) 10/18/2022 1706   BUN 15 10/18/2022 1706   CREATININE 0.89 10/18/2022 1706   CALCIUM 9.5 10/18/2022 1706   GFRNONAA >60 10/18/2022 1706    BNP No results found for: BNP  ProBNP No results found for: PROBNP  Imaging: MR BRAIN WO CONTRAST Result Date: 03/23/2023 Table formatting from the original result was not included. [facility] NEUROIMAGING REPORT STUDY DATE: 03/22/23 PATIENT NAME: Adrienne Luna DOB: March 31, 1945 MRN: [id number] ORDERING CLINICIAN: Onita Duos, MD CLINICAL HISTORY: 78 y.o. year old female with: 1. Chronic migraine without aura without status migrainosus, not intractable  2. Cervical dystonia  3. Neck pain   EXAM: MR BRAIN WO CONTRAST TECHNIQUE: MRI of the brain without contrast was obtained utilizing multiplanar, multiecho pulse sequences. CONTRAST: Diagnostic Product Medications (last 72 hours)   None   COMPARISON: 01/15/22 MRI IMAGING SITE: [facility]  MRI Imaging [address] [place] [address] FINDINGS: No abnormal lesions are seen on diffusion-weighted views to suggest acute ischemia. The cortical sulci, fissures and cisterns are normal in size and appearance. Lateral, third and fourth ventricle are normal in size and  appearance. No extra-axial fluid collections are seen. No evidence of mass effect or midline shift.  Minimal periventricular subcortical foci of nonspecific T2 hyperintensities. On sagittal views the posterior fossa, pituitary gland and corpus callosum are unremarkable. No evidence of intracranial hemorrhage on SWI views. The orbits and their contents, paranasal sinuses and calvarium are unremarkable.  Intracranial flow voids are present.   Unremarkable MRI brain (without). No acute findings. INTERPRETING PHYSICIAN: EDUARD FABIENE HANLON, MD Certified in Neurology, Neurophysiology and Neuroimaging [facility] [address], [address] [place], [place] [address] [phone]    Assessment & Plan:   1. DOE (dyspnea on exertion) (Primary)  2. Non-seasonal allergic rhinitis, unspecified trigger     Dyspnea on Exertion Reports exertional dyspnea, particularly with stair climbing and nocturnal wheeze. Normal lung function tests without evidence of obstructive lung disease. Possible mild intermittent asthma given clinical history and borderline bronchodilator response. -Attempt trial of Advair inhaler, pending affordability. If not affordable, patient to message doctor for alternative options.  Allergic Rhinitis Positive allergy  testing for dust mites and several grasses. PND is the main contributor to cough. -Start Claritin  (loratadine ) daily. -Start Atrovent  (ipratropium bromide ) nasal spray.  Follow-up If symptoms persist or worsen, consider referral to cardiology for further evaluation of exertional dyspnea.         Almarie LELON Ferrari, NP 04/11/2023

## 2023-04-11 NOTE — Patient Instructions (Signed)
 Full PFT performed today.

## 2023-04-11 NOTE — Progress Notes (Signed)
 Full PFT performed today.

## 2023-04-12 NOTE — Therapy (Signed)
 OUTPATIENT PHYSICAL THERAPY VESTIBULAR TREATMENT     Patient Name: Adrienne Luna MRN: [id number] DOB:Nov 27, 1945, 78 y.o., female Today's Date: 04/13/2023  END OF SESSION:  PT End of Session - 04/13/23 1527     Visit Number 2    Number of Visits 12    Date for PT Re-Evaluation 05/19/23    Authorization Type Medicare/[employer]    Progress Note Due on Visit 10    PT Start Time 1443    PT Stop Time 1528    PT Time Calculation (min) 45 min    Equipment Utilized During Treatment Gait belt    Activity Tolerance Patient tolerated treatment well    Behavior During Therapy WFL for tasks assessed/performed              Past Medical History:  Diagnosis Date   IBS (irritable bowel syndrome)    Past Surgical History:  Procedure Laterality Date   ABDOMINAL HYSTERECTOMY     CESAREAN SECTION     x2   Patient Active Problem List   Diagnosis Date Noted   Osteoarthritis 02/09/2023   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   Neck pain 01/17/2023   Diverticulitis of intestine, part unspecified, without perforation or abscess without bleeding 04/28/2020   Unspecified menopausal and perimenopausal disorder 12/14/2016   Pain in right shoulder 12/14/2016   Other specified disorders of bone density and structure, unspecified site 12/14/2016   Insomnia, unspecified 12/14/2016   Hyperlipidemia, unspecified 12/14/2016   History of colonic polyps 12/14/2016   Gastro-esophageal reflux disease without esophagitis 12/14/2016   Essential (primary) hypertension 12/14/2016   Vitamin D deficiency, unspecified 12/14/2016   Chronic migraine without aura without status migrainosus, not intractable 12/16/2014   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 The HA does not fell like a migraine- feels like pain behind the L ear and it feels like 10/10. Took some meds and it took it down to a 8/10. Having some nausea d/t taking meds.   Pt accompanied by: self  PERTINENT HISTORY: hx cervical dystonia, torticollis: pt stated that she has occipita neuralgia, previous botox  for cervical dystonia that helped ; HLD, GERD, chronic migraines  PAIN:  Are you having pain? Yes: NPRS scale: up to 10/10; 8/10 with medication Pain location: L neck and into head Pain description: head rush; sore to touch Aggravating factors: bending head and bending over, turning quickly Relieving factors: butalbital  medication helps  PRECAUTIONS: Fall and Other: dizziness and quick motions produce dizziness and pain  RED FLAGS: None   WEIGHT BEARING RESTRICTIONS: No  FALLS: Has patient fallen in last 6 months? No  LIVING ENVIRONMENT: Lives with: lives with their family Lives in: House/apartment Has following equipment at home:  uses cane at times  PLOF: Independent  PATIENT GOALS: To get back to normal-get rid of dizziness and pain  OBJECTIVE:       TODAY'S TREATMENT: 04/13/23  MOTION SENSITIVITY:  Motion Sensitivity Quotient Intensity: 0 = none, 1 = Lightheaded, 2 =  Mild, 3 = Moderate, 4 = Severe, 5 = Vomiting  Intensity  1. Sitting to supine 1  2. Supine to L side 1  3. Supine to R side 0  4. Supine to sitting 0  5. L Hallpike-Dix 2  6. Up from L  2  7. R Hallpike-Dix 1  8. Up from R  1  9. Sitting, head tipped to L knee 1  10. Head up from L knee 2  11. Sitting, head tipped to R knee 0  12. Head up from R knee 0  13. Sitting head turns x5 0  14.Sitting head nods x5 0   15. In stance, 180 turn to L  0  16. In stance, 180 turn to R 0      Activity Comments  Sitting, head tipped to L knee 3x Tolerated well   FGA 17        M-CTSIB  Condition 1: Firm Surface, EO 30 Sec, Mild Sway  Condition 2: Firm Surface, EC 30 Sec, Mild and Moderate Sway  Condition 3: Foam Surface, EO 30 Sec, Mild and Moderate Sway  Condition 4: Foam Surface, EC 30 Sec, Moderate Sway       [facility] PT Assessment - 04/13/23 0001       Functional Gait  Assessment   Gait assessed  Yes    Gait Level Surface Walks 20 ft in less than 7 sec but greater than 5.5 sec, uses assistive device, slower speed, mild gait deviations, or deviates 6-10 in outside of the 12 in walkway width.    Change in Gait Speed Makes only minor adjustments to walking speed, or accomplishes a change in speed with significant gait deviations, deviates 10-15 in outside the 12 in walkway width, or changes speed but loses balance but is able to recover and continue walking.    Gait with Horizontal Head Turns Performs head turns smoothly with slight change in gait velocity (eg, minor disruption to smooth gait path), deviates 6-10 in outside 12 in walkway width, or uses an assistive device.    Gait with Vertical Head Turns Performs task with slight change in gait velocity (eg, minor disruption to smooth gait path), deviates 6 - 10 in outside 12 in walkway width or uses assistive device    Gait and Pivot Turn Cannot turn safely, requires assistance to turn and stop.    Step Over Obstacle Is able to step over 2 stacked shoe boxes taped together (9 in total height) without changing gait speed. No evidence of imbalance.    Gait with Narrow Base of Support Ambulates 4-7 steps.    Gait with Eyes Closed Walks 20 ft, slow speed, abnormal gait pattern, evidence for imbalance, deviates 10-15 in outside 12 in walkway width. Requires more than 9 sec to ambulate 20 ft.    Ambulating Backwards Walks 20 ft, uses assistive device, slower  speed, mild gait deviations, deviates 6-10 in outside 12 in walkway width.    Steps Alternating feet, no rail.    Total Score 17             HOME EXERCISE PROGRAM Last updated: 04/13/23 Access Code: [email or web address] URL: [email or web address] Date: 04/13/2023 Prepared by: [facility] - Outpatient  Rehab - [facility]  Program Notes perform standing exercises at counter for safety  Exercises - Seated Cervical Retraction  - 1-2 x daily - 7 x weekly - 2 sets - 5-10 reps - 3 sec hold - Seated Nose to Left Knee Vestibular Habituation  -  1 x daily - 5 x weekly - 2 sets - 3-5 reps - Standing Toe Taps  - 1 x daily - 5 x weekly - 2 sets - 10 reps - Romberg Stance with Eyes Closed  - 1 x daily - 5 x weekly - 2 sets - 30 sec hold - Walking with Eyes Closed and Counter Support  - 1 x daily - 5 x weekly - 2 sets - 5 reps  PATIENT EDUCATION: Education details: exam findings, HEP update with edu for safety Person educated: Patient Education method: Explanation, Demonstration, Tactile cues, Verbal cues, and Handouts Education comprehension: verbalized understanding and returned demonstration    Note: Objective measures were completed at Evaluation unless otherwise noted.  DIAGNOSTIC FINDINGS: This MRI of cervical spine with and without contrast shows the following: The spinal cord appears normal.   Multilevel degenerative changes from C2-T1 as detailed above.  Although the central canal is mildly narrowed at several levels, there is no spinal stenosis.  Multilevel foraminal narrowing but no nerve root compression. At C3-C4, there is severe loss of disc height and ankylosis of the left facet joint associated with mild facet hypertrophy.  There is foraminal narrowing but no spinal stenosis or nerve root compression.  Unremarkable MRI brain . No acute findings   COGNITION: Overall cognitive status: Within functional limits for tasks assessed   SENSATION: Does report hx of carpal  tunnel; so some tingling; occasional numbness into L arm and hand  POSTURE:  rounded shoulders, forward head, and   slight tilt towards R side  Cervical ROM:    Active A/PROM (deg) eval  Flexion 18 degrees, tightness  Extension 8 degrees pain  Right lateral flexion   Left lateral flexion   Right rotation 49  Left rotation 35 pain  (Blank rows = not tested)  TRANSFERS: Assistive device utilized: None  Sit to stand: Complete Independence Stand to sit: Complete Independence  GAIT: Gait pattern: WFL  PATIENT SURVEYS:  DHI Score 72 (>54 points indicates severe handicap)  PALPATION: Tenderness to palpation L UT and levator  VESTIBULAR ASSESSMENT:  GENERAL OBSERVATION: Pt in no acute distress, but posture is somewhat guarded due to reports of dizziness   SYMPTOM BEHAVIOR: Subjective history: Have occipital neuralgia, for which I got periodic injections (Botox  every 4-5 months) when I lived in [place].  When I had the Botox  injection with Dr. Onita on December 4th, I had never had pain that way and pain shot into my head and scalp.  The pain has persisted and I can't bend over because I get dizzy and get a rush of pain into my head.  Almost makes me sick.  Turning over either way causes a rush into my head. Reports sensation a shooting pain>dizziness  Non-Vestibular symptoms: diplopia, neck pain, headaches, and migraine symptoms  Pt reports diplopia with L head turn to end range  Type of dizziness: Imbalance (Disequilibrium), Unsteady with head/body turns, and fogginess  Frequency: multiple times/day  Duration: few minutes  Aggravating factors: Induced by motion: bending down to the ground, turning body quickly, and turning head quickly  Relieving factors: closing eyes and slow movements  Progression of symptoms: unchanged  OCULOMOTOR EXAM: wears bifocals/progressives  Ocular Alignment: normal  Ocular ROM: No Limitations  Spontaneous Nystagmus: absent  Gaze-Induced Nystagmus:  absent  Smooth Pursuits: intact  Saccades: intact  Convergence/Divergence: NT    VESTIBULAR - OCULAR REFLEX:   Slow VOR: Normal and Comment: no dizziness, but pain in L neck  VOR  Cancellation: Normal  Head-Impulse Test: HIT Right: negative HIT Left: negative  Dynamic Visual Acuity:  NT  *Pt has history of multiple eye surgeries on L eye (from an injury years ago)- with some motions above, pt's L eye has very slowed abduction movements  POSITIONAL TESTING: Did not test, due to pt not c/o positional vertigo symptoms  MOTION SENSITIVITY:  Motion Sensitivity Quotient Intensity: 0 = none, 1 = Lightheaded, 2 = Mild, 3 = Moderate, 4 = Severe, 5 = Vomiting  Intensity  1. Sitting to supine   2. Supine to L side   3. Supine to R side   4. Supine to sitting   5. L Hallpike-Dix   6. Up from L    7. R Hallpike-Dix   8. Up from R    9. Sitting, head tipped to L knee   10. Head up from L knee   11. Sitting, head tipped to R knee   12. Head up from R knee   13. Sitting head turns x5   14.Sitting head nods x5   15. In stance, 180 turn to L    16. In stance, 180 turn to R                                                                                                                                  TREATMENT DATE: 04/10/2023   Other: Initiated gentle stretching neck-attempted neck retraction/extension and cervical rotation with towel; pt has discomfort Performed seated chin tuck/neck retraction, A/ROM, with no c/o increased pain (provided as HEP)  PATIENT EDUCATION: Education details: Eval results, POC, HEP initiated Person educated: Patient Education method: Explanation, Demonstration, Verbal cues, and Handouts Education comprehension: verbalized understanding and returned demonstration  HOME EXERCISE PROGRAM: Access Code: [email or web address] URL: [email or web address] Date: 04/11/2023 Prepared by: [facility] - Outpatient  Rehab - [facility]  Exercises - Seated  Cervical Retraction  - 1-2 x daily - 7 x weekly - 2 sets - 5-10 reps - 3 sec hold  GOALS: Goals reviewed with patient? Yes  SHORT TERM GOALS: Target date: 04/21/2023  Pt will be independent with HEP for improved pain and dizziness. Baseline: Goal status: IN PROGRESS  LONG TERM GOALS: Target date: 05/19/2023  Pt will be independent with progression of HEP for improved pain and dizziness. Baseline:  Goal status: IN PROGRESS  2.  Pt will report pain decreased by at least 50% for improved participation in daily activities. Baseline: 8-10/10 Goal status: IN PROGRESS  3.  Pt will improve DHI score to less than or equal to 54, to demo decreased limitations by dizziness in daily activities. Baseline:  Goal status: IN PROGRESS  4.  FGA score to be assessed, with score to improve to at least 22/30 for decreased fall risk/improved functional mobility. Baseline: 17 04/13/23 Goal status: IN PROGRESS 04/13/23   ASSESSMENT:  CLINICAL IMPRESSION: Patient arrived to session with report of L sided posterior HA  this AM which was 10/10, now down to 8/10 currently. MSQ revealed motion sensitivity, more significant to L side. Balance testing revealed increased risk of falls and reduced multisensory balance. HEP was updated to address deficits identified today. Patient without complaints upon leaving.   OBJECTIVE IMPAIRMENTS: Abnormal gait, decreased ROM, dizziness, impaired flexibility, postural dysfunction, and pain.   ACTIVITY LIMITATIONS: bending, standing, squatting, reach over head, locomotion level, and caring for others  PARTICIPATION LIMITATIONS: meal prep, cleaning, laundry, driving, shopping, and community activity  PERSONAL FACTORS: 3+ comorbidities: see PMH  are also affecting patient's functional outcome.   REHAB POTENTIAL: Good  CLINICAL DECISION MAKING: Stable/uncomplicated  EVALUATION COMPLEXITY: Low   PLAN:  PT FREQUENCY: 2x/week  PT DURATION: 6 weeks  PLANNED  INTERVENTIONS: 97110-Therapeutic exercises, 97530- Therapeutic activity, 97112- Neuromuscular re-education, 97535- Self Care, [id number]- Manual therapy, Patient/Family education, Balance training, Dry Needling, Vestibular training, and Moist heat  PLAN FOR NEXT SESSION:  progress gentle neck stretches, STM to L shoulder/neck muscles; possibility of DN for pain in conjunction with stretching?   Louana Terrilyn Christians, PT, DPT 04/13/23 3:32 PM  [facility] [address] [place], [address] [place], [place] [address] Phone # [phone] Fax # [phone]

## 2023-04-12 NOTE — Telephone Encounter (Signed)
 Pts pharmacy is requesting alternative for advair inhaler. Please advise beth walsh.

## 2023-04-13 ENCOUNTER — Ambulatory Visit: Payer: Medicare Other | Admitting: Physical Therapy

## 2023-04-13 ENCOUNTER — Encounter: Payer: Self-pay | Admitting: Physical Therapy

## 2023-04-13 DIAGNOSIS — M542 Cervicalgia: Secondary | ICD-10-CM | POA: Diagnosis not present

## 2023-04-13 DIAGNOSIS — R42 Dizziness and giddiness: Secondary | ICD-10-CM

## 2023-04-13 MED ORDER — FLUTICASONE-SALMETEROL 45-21 MCG/ACT IN AERO: 2.0000 | INHALATION_SPRAY | Freq: Two times a day (BID) | RESPIRATORY_TRACT | 12 refills | Status: AC

## 2023-04-13 NOTE — Telephone Encounter (Signed)
 Sent in alternative listed Advair 45-21mcg two puffs twice daily

## 2023-04-14 NOTE — Telephone Encounter (Signed)
 I have left a message on their machine to have them return call. No answer at this time.  Spoke with pharmacy. This is a covered medication but the $240 is going towards her deductible.

## 2023-04-14 NOTE — Telephone Encounter (Signed)
 Explained Msg left for PT. She expressed understanding. NFN.

## 2023-04-18 ENCOUNTER — Ambulatory Visit: Payer: Medicare Other | Admitting: Physical Therapy

## 2023-04-19 NOTE — Therapy (Signed)
OUTPATIENT PHYSICAL THERAPY VESTIBULAR TREATMENT     Patient Name: Adrienne Luna MRN: [id number] DOB:1945/04/07, 78 y.o., female Today's Date: 04/20/2023  END OF SESSION:  PT End of Session - 04/20/23 1235     Visit Number 3    Number of Visits 12    Date for PT Re-Evaluation 05/19/23    Authorization Type Medicare/[employer]    Progress Note Due on Visit 10    PT Start Time 1151    PT Stop Time 1230    PT Time Calculation (min) 39 min    Activity Tolerance Patient tolerated treatment well    Behavior During Therapy WFL for tasks assessed/performed               Past Medical History:  Diagnosis Date   IBS (irritable bowel syndrome)    Past Surgical History:  Procedure Laterality Date   ABDOMINAL HYSTERECTOMY     CESAREAN SECTION     x2   Patient Active Problem List   Diagnosis Date Noted   Osteoarthritis 02/09/2023   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   Neck pain 01/17/2023   Diverticulitis of intestine, part unspecified, without perforation or abscess without bleeding 04/28/2020   Unspecified menopausal and perimenopausal disorder 12/14/2016   Pain in right shoulder 12/14/2016   Other specified disorders of bone density and structure, unspecified site 12/14/2016   Insomnia, unspecified 12/14/2016   Hyperlipidemia, unspecified 12/14/2016   History of colonic polyps 12/14/2016   Gastro-esophageal reflux disease without esophagitis 12/14/2016   Essential (primary) hypertension 12/14/2016   Vitamin D deficiency, unspecified 12/14/2016   Chronic migraine without aura without status migrainosus, not intractable 12/16/2014   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 Was using a "tera-hertz" blower on her L knee and started using it on the L neck and found a lump. Requesting to check this out and see what it is.   Pt accompanied by: self  PERTINENT HISTORY: hx cervical dystonia, torticollis: pt stated that she has occipita neuralgia, previous botox for cervical dystonia that helped ; HLD, GERD, chronic migraines  PAIN:  Are you having pain? Yes: NPRS scale: "bad" Pain location: L neck and into head Pain description: head rush; sore to touch Aggravating factors: bending head and bending over, turning quickly Relieving factors: butalbital medication helps  PRECAUTIONS: Fall and Other: dizziness and quick motions produce dizziness and pain  RED FLAGS: None   WEIGHT BEARING RESTRICTIONS: No  FALLS: Has patient fallen in last 6 months? No  LIVING ENVIRONMENT: Lives with: lives with their family Lives in: House/apartment Has following equipment at home:  uses cane at times  PLOF: Independent  PATIENT GOALS: To get back to normal-get rid of dizziness and pain  OBJECTIVE:      TODAY'S TREATMENT: 04/20/23 Activity Comments  review of HEP update: -Seated Nose to Left Knee Vestibular Habituation 2x each  - Standing Toe Taps  - Romberg Stance with Eyes Closed 30" - Walking with Eyes Closed and Counter Support 4x No dizziness. Good stability with toes taps. Mild-mod sway in  romberg. Edu on slow safe progression of reducing UE support with balance exercises   STM and manual TPR to neck Soft tissue restriction and TTP in L UT, LS, scalenes, cervical paraspinals, and suboccipitals. Pt reported reduced HA  sitting gentle cervical AROM in all directions to tolerance (flex, ex, R/L rotation, R/L sidebending) Reports less pain with L rotation than before   Edu  and practice using self-STM using ball on wall  Tolerated well over L UT         PATIENT EDUCATION: Education details: review and update of HEP- advised not to push into pain  Person educated: Patient Education method: Explanation, Demonstration, Tactile cues, Verbal cues, and Handouts Education comprehension: verbalized understanding and returned demonstration    HOME EXERCISE PROGRAM Access Code: [email or web address] URL: [email or web address] Date: 04/20/2023 Prepared by: [facility] - Outpatient  Rehab - [facility]  Program Notes perform standing exercises at counter for safety. perform neck stretches to tolerance.  Exercises - Seated Cervical Retraction  - 1-2 x daily - 7 x weekly - 2 sets - 5-10 reps - 3 sec hold - Standing Toe Taps  - 1 x daily - 5 x weekly - 2 sets - 10 reps - Romberg Stance with Eyes Closed  - 1 x daily - 5 x weekly - 2 sets - 30 sec hold - Walking with Eyes Closed and Counter Support  - 1 x daily - 5 x weekly - 2 sets - 5 reps - Seated Cervical Rotation AROM  - 1 x daily - 5 x weekly - 2 sets - 10 reps - Seated Cervical Sidebending AROM  - 1 x daily - 5 x weekly - 2 sets - 10 reps - Seated Cervical Flexion AROM  - 1 x daily - 5 x weekly - 2 sets - 10 reps - Seated Cervical Extension AROM  - 1 x daily - 5 x weekly - 2 sets - 10 reps    Note: Objective measures were completed at Evaluation unless otherwise noted.  DIAGNOSTIC FINDINGS: This MRI of cervical spine with and without contrast shows the following: The spinal cord appears normal.   Multilevel degenerative changes from C2-T1 as detailed above.  Although the central canal is mildly narrowed at several levels, there is no spinal stenosis.  Multilevel foraminal narrowing but no nerve root compression. At C3-C4, there is severe loss of disc height and ankylosis of the left facet joint associated with mild facet hypertrophy.  There is foraminal narrowing but no spinal stenosis or nerve root  compression.  Unremarkable MRI brain . No acute findings   COGNITION: Overall cognitive status: Within functional limits for tasks assessed   SENSATION: Does report hx of carpal tunnel; so some tingling; occasional numbness into L arm and hand  POSTURE:  rounded shoulders, forward head, and   slight tilt towards R side  Cervical ROM:    Active A/PROM (deg) eval  Flexion 18 degrees, tightness  Extension 8 degrees pain  Right lateral flexion   Left lateral flexion   Right rotation 49  Left rotation 35 pain  (Blank rows = not tested)  TRANSFERS: Assistive device utilized: None  Sit to stand: Complete Independence Stand to sit: Complete Independence  GAIT: Gait pattern: WFL  PATIENT SURVEYS:  DHI Score 72 (>54 points indicates severe handicap)  PALPATION: Tenderness to palpation L UT and levator  VESTIBULAR ASSESSMENT:  GENERAL OBSERVATION: Pt in no acute distress, but posture is somewhat guarded due to reports of dizziness  SYMPTOM BEHAVIOR: Subjective history: Have occipital neuralgia, for which I got periodic injections (Botox every 4-5 months) when I lived in [place].  When I had the Botox injection with Dr. Terrace Arabia on December 4th, I had never had pain that way and pain shot into my head and scalp.  The pain has persisted and I can't bend over because I get dizzy and get a rush of pain into my head.  Almost makes me sick.  Turning over either way causes a "rush" into my head. Reports sensation a shooting pain>dizziness  Non-Vestibular symptoms: diplopia, neck pain, headaches, and migraine symptoms  Pt reports diplopia with L head turn to end range  Type of dizziness: Imbalance (Disequilibrium), Unsteady with head/body turns, and fogginess  Frequency: multiple times/day  Duration: few minutes  Aggravating factors: Induced by motion: bending down to the ground, turning body quickly, and turning head quickly  Relieving factors: closing eyes and slow movements  Progression of  symptoms: unchanged  OCULOMOTOR EXAM: wears bifocals/progressives  Ocular Alignment: normal  Ocular ROM: No Limitations  Spontaneous Nystagmus: absent  Gaze-Induced Nystagmus: absent  Smooth Pursuits: intact  Saccades: intact  Convergence/Divergence: NT    VESTIBULAR - OCULAR REFLEX:   Slow VOR: Normal and Comment: no dizziness, but pain in L neck  VOR Cancellation: Normal  Head-Impulse Test: HIT Right: negative HIT Left: negative  Dynamic Visual Acuity:  NT  *Pt has history of multiple eye surgeries on L eye (from an injury years ago)- with some motions above, pt's L eye has very slowed abduction movements  POSITIONAL TESTING: Did not test, due to pt not c/o positional vertigo symptoms  MOTION SENSITIVITY:  Motion Sensitivity Quotient Intensity: 0 = none, 1 = Lightheaded, 2 = Mild, 3 = Moderate, 4 = Severe, 5 = Vomiting  Intensity  1. Sitting to supine   2. Supine to L side   3. Supine to R side   4. Supine to sitting   5. L Hallpike-Dix   6. Up from L    7. R Hallpike-Dix   8. Up from R    9. Sitting, head tipped to L knee   10. Head up from L knee   11. Sitting, head tipped to R knee   12. Head up from R knee   13. Sitting head turns x5   14.Sitting head nods x5   15. In stance, 180 turn to L    16. In stance, 180 turn to R                                                                                                                                  TREATMENT DATE: 04/10/2023   Other: Initiated gentle stretching neck-attempted neck retraction/extension and cervical rotation with towel; pt has discomfort Performed seated chin tuck/neck retraction, A/ROM, with no c/o increased pain (provided as HEP)  PATIENT EDUCATION: Education details: Eval results, POC, HEP initiated Person educated: Patient  Education method: Explanation, Demonstration, Verbal cues, and Handouts Education comprehension: verbalized understanding and returned demonstration  HOME EXERCISE  PROGRAM: Access Code: [email or web address] URL: [email or web address] Date: 04/11/2023 Prepared by: [facility] - Outpatient  Rehab - [facility]  Exercises - Seated Cervical Retraction  - 1-2 x daily - 7 x weekly - 2 sets - 5-10 reps - 3 sec hold  GOALS: Goals reviewed with patient? Yes  SHORT TERM GOALS: Target date: 04/21/2023  Pt will be independent with HEP for improved pain and dizziness. Baseline: Goal status: IN PROGRESS  LONG TERM GOALS: Target date: 05/19/2023  Pt will be independent with progression of HEP for improved pain and dizziness. Baseline:  Goal status: IN PROGRESS  2.  Pt will report pain decreased by at least 50% for improved participation in daily activities. Baseline: 8-10/10 Goal status: IN PROGRESS  3.  Pt will improve DHI score to less than or equal to 54, to demo decreased limitations by dizziness in daily activities. Baseline:  Goal status: IN PROGRESS  4.  FGA score to be assessed, with score to improve to at least 22/30 for decreased fall risk/improved functional mobility. Baseline: 17 04/13/23 Goal status: IN PROGRESS 04/13/23   ASSESSMENT:  CLINICAL IMPRESSION: Patient arrived to session with report of "bad" HA this morning. Review of HEP revealed improved tolerance for bending habituation. Patient tolerated MT to address L sided cervical muscle tension and reported improved tolerance for head rotation and decreased HA after MT. Proceeded with gentle cervical stretching to tolerance. Patient report understanding of HEP and without complaints upon leaving.   OBJECTIVE IMPAIRMENTS: Abnormal gait, decreased ROM, dizziness, impaired flexibility, postural dysfunction, and pain.   ACTIVITY LIMITATIONS: bending, standing, squatting, reach over head, locomotion level, and caring for others  PARTICIPATION LIMITATIONS: meal prep, cleaning, laundry, driving, shopping, and community activity  PERSONAL FACTORS: 3+ comorbidities: see PMH  are also  affecting patient's functional outcome.   REHAB POTENTIAL: Good  CLINICAL DECISION MAKING: Stable/uncomplicated  EVALUATION COMPLEXITY: Low   PLAN:  PT FREQUENCY: 2x/week  PT DURATION: 6 weeks  PLANNED INTERVENTIONS: 97110-Therapeutic exercises, 97530- Therapeutic activity, 97112- Neuromuscular re-education, 97535- Self Care, 30865- Manual therapy, Patient/Family education, Balance training, Dry Needling, Vestibular training, and Moist heat  PLAN FOR NEXT SESSION:  progress gentle neck stretches, STM to L shoulder/neck muscles; possibility of DN for pain in conjunction with stretching?   Baldemar Friday, PT, DPT 04/20/23 12:40 PM  [facility] [place] [facility] [address] [place], [address] [place], [place] [address] Phone # [phone] Fax # [phone]

## 2023-04-20 ENCOUNTER — Encounter: Payer: Self-pay | Admitting: Physical Therapy

## 2023-04-20 ENCOUNTER — Ambulatory Visit: Payer: Medicare Other | Admitting: Physical Therapy

## 2023-04-20 DIAGNOSIS — M542 Cervicalgia: Secondary | ICD-10-CM

## 2023-04-20 DIAGNOSIS — R42 Dizziness and giddiness: Secondary | ICD-10-CM

## 2023-04-24 NOTE — Therapy (Signed)
OUTPATIENT PHYSICAL THERAPY VESTIBULAR TREATMENT     Patient Name: Adrienne Luna MRN: [id number] DOB:February 16, 1946, 78 y.o., female Today's Date: 04/25/2023  END OF SESSION:  PT End of Session - 04/25/23 1209     Visit Number 4    Number of Visits 12    Date for PT Re-Evaluation 05/19/23    Authorization Type Medicare/[employer]    Progress Note Due on Visit 10    PT Start Time 1144    PT Stop Time 1228    PT Time Calculation (min) 44 min    Activity Tolerance Patient tolerated treatment well    Behavior During Therapy [facility] for tasks assessed/performed                Past Medical History:  Diagnosis Date   IBS (irritable bowel syndrome)    Past Surgical History:  Procedure Laterality Date   ABDOMINAL HYSTERECTOMY     CESAREAN SECTION     x2   Patient Active Problem List   Diagnosis Date Noted   Osteoarthritis 02/09/2023   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   Neck pain 01/17/2023   Diverticulitis of intestine, part unspecified, without perforation or abscess without bleeding 04/28/2020   Unspecified menopausal and perimenopausal disorder 12/14/2016   Pain in right shoulder 12/14/2016   Other specified disorders of bone density and structure, unspecified site 12/14/2016   Insomnia, unspecified 12/14/2016   Hyperlipidemia, unspecified 12/14/2016   History of colonic polyps 12/14/2016   Gastro-esophageal reflux disease without esophagitis 12/14/2016   Essential (primary) hypertension 12/14/2016   Vitamin D deficiency, unspecified 12/14/2016   Chronic migraine without aura without status migrainosus, not intractable 12/16/2014   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 Reports wearing tennis shoes for improved stability. Reports some remaining stiffness in the neck but no pain.   Pt accompanied by: self  PERTINENT HISTORY: hx cervical dystonia, torticollis: pt stated that she has occipita neuralgia, previous botox for cervical dystonia that helped ; HLD, GERD, chronic migraines  PAIN:  Are you having pain? Yes: NPRS scale: no Pain location: L neck and into head Pain description: head rush; sore to touch Aggravating factors: bending head and bending over, turning quickly Relieving factors: butalbital medication helps  PRECAUTIONS: Fall and Other: dizziness and quick motions produce dizziness and pain  RED FLAGS: None   WEIGHT BEARING RESTRICTIONS: No  FALLS: Has patient fallen in last 6 months? No  LIVING ENVIRONMENT: Lives with: lives with their family Lives in: House/apartment Has following equipment at home:  uses cane at times  PLOF: Independent  PATIENT GOALS: To get back to normal-get rid of dizziness and pain  OBJECTIVE:     TODAY'S TREATMENT: 04/25/23 Activity Comments  supine and sitting STM, manual TPR, and jt mobs grade 3 PA's to neck Soft tissue restriction and trigger pts in L scalenes, UT, LS, cervical paraspinals, suboccipitals; pt reported relief   sitting gentle cervical AROM in all directions to tolerance Reports some R sided neck pain with L rotation   Cervical rotation  SNAG 10x To tolerance; cues to avoid compensating with trunk movement and proper hand placement   Cervical extension SNAG 10x To tolerance  20# Pulley row 2x10  Staggered stance; cues for scap retraction and depression                HOME EXERCISE PROGRAM Last updated: 04/25/23 Access Code: [email or web address] URL:  [email or web address] Date: 04/25/2023 Prepared by: [facility] - Outpatient  Rehab - [facility]  Program Notes perform standing exercises at counter for safety. perform neck stretches to tolerance.  Exercises - Seated Cervical Retraction  - 1-2 x daily - 7 x weekly - 2 sets - 5-10 reps - 3 sec hold - Standing Toe Taps  - 1 x daily - 5 x weekly - 2 sets - 10 reps - Romberg Stance with Eyes Closed  - 1 x daily - 5 x weekly - 2 sets - 30 sec hold - Walking with Eyes Closed and Counter Support  - 1 x daily - 5 x weekly - 2 sets - 5 reps - Seated Cervical Sidebending AROM  - 1 x daily - 5 x weekly - 2 sets - 10 reps - Seated Cervical Flexion AROM  - 1 x daily - 5 x weekly - 2 sets - 10 reps - Seated Assisted Cervical Rotation with Towel  - 1 x daily - 5 x weekly - 2 sets - 10 reps - Mid-Lower Cervical Extension SNAG with Strap  - 1 x daily - 5 x weekly - 2 sets - 10 reps   PATIENT EDUCATION: Education details: HEP update Person educated: Patient Education method: Explanation, Demonstration, Tactile cues, Verbal cues, and Handouts Education comprehension: verbalized understanding and returned demonstration     Note: Objective measures were completed at Evaluation unless otherwise noted.  DIAGNOSTIC FINDINGS: This MRI of cervical spine with and without contrast shows the following: The spinal cord appears normal.   Multilevel degenerative changes from C2-T1 as detailed above.  Although the central canal is mildly narrowed at several levels, there is no spinal stenosis.  Multilevel foraminal narrowing but no nerve root compression. At C3-C4, there is severe loss of disc height and ankylosis of the left facet joint associated with mild facet hypertrophy.  There is foraminal narrowing but no spinal stenosis or nerve root compression.  Unremarkable MRI brain . No acute findings   COGNITION: Overall cognitive status: Within functional limits for tasks  assessed   SENSATION: Does report hx of carpal tunnel; so some tingling; occasional numbness into L arm and hand  POSTURE:  rounded shoulders, forward head, and   slight tilt towards R side  Cervical ROM:    Active A/PROM (deg) eval  Flexion 18 degrees, tightness  Extension 8 degrees pain  Right lateral flexion   Left lateral flexion   Right rotation 49  Left rotation 35 pain  (Blank rows = not tested)  TRANSFERS: Assistive device utilized: None  Sit to stand: Complete Independence Stand to sit: Complete Independence  GAIT: Gait pattern: WFL  PATIENT SURVEYS:  DHI Score 72 (>54 points indicates severe handicap)  PALPATION: Tenderness to palpation L UT and levator  VESTIBULAR ASSESSMENT:  GENERAL OBSERVATION: Pt in no acute distress, but posture is somewhat guarded due to reports of dizziness   SYMPTOM BEHAVIOR: Subjective history: Have occipital neuralgia, for which I got periodic injections (Botox every 4-5 months) when I lived in [place].  When I had the Botox injection with Dr. Terrace Arabia on December 4th, I had never  had pain that way and pain shot into my head and scalp.  The pain has persisted and I can't bend over because I get dizzy and get a rush of pain into my head.  Almost makes me sick.  Turning over either way causes a "rush" into my head. Reports sensation a shooting pain>dizziness  Non-Vestibular symptoms: diplopia, neck pain, headaches, and migraine symptoms  Pt reports diplopia with L head turn to end range  Type of dizziness: Imbalance (Disequilibrium), Unsteady with head/body turns, and fogginess  Frequency: multiple times/day  Duration: few minutes  Aggravating factors: Induced by motion: bending down to the ground, turning body quickly, and turning head quickly  Relieving factors: closing eyes and slow movements  Progression of symptoms: unchanged  OCULOMOTOR EXAM: wears bifocals/progressives  Ocular Alignment: normal  Ocular ROM: No  Limitations  Spontaneous Nystagmus: absent  Gaze-Induced Nystagmus: absent  Smooth Pursuits: intact  Saccades: intact  Convergence/Divergence: NT    VESTIBULAR - OCULAR REFLEX:   Slow VOR: Normal and Comment: no dizziness, but pain in L neck  VOR Cancellation: Normal  Head-Impulse Test: HIT Right: negative HIT Left: negative  Dynamic Visual Acuity:  NT  *Pt has history of multiple eye surgeries on L eye (from an injury years ago)- with some motions above, pt's L eye has very slowed abduction movements  POSITIONAL TESTING: Did not test, due to pt not c/o positional vertigo symptoms  MOTION SENSITIVITY:  Motion Sensitivity Quotient Intensity: 0 = none, 1 = Lightheaded, 2 = Mild, 3 = Moderate, 4 = Severe, 5 = Vomiting  Intensity  1. Sitting to supine   2. Supine to L side   3. Supine to R side   4. Supine to sitting   5. L Hallpike-Dix   6. Up from L    7. R Hallpike-Dix   8. Up from R    9. Sitting, head tipped to L knee   10. Head up from L knee   11. Sitting, head tipped to R knee   12. Head up from R knee   13. Sitting head turns x5   14.Sitting head nods x5   15. In stance, 180 turn to L    16. In stance, 180 turn to R                                                                                                                                  TREATMENT DATE: 04/10/2023   Other: Initiated gentle stretching neck-attempted neck retraction/extension and cervical rotation with towel; pt has discomfort Performed seated chin tuck/neck retraction, A/ROM, with no c/o increased pain (provided as HEP)  PATIENT EDUCATION: Education details: Eval results, POC, HEP initiated Person educated: Patient Education method: Explanation, Demonstration, Verbal cues, and Handouts Education comprehension: verbalized understanding and returned demonstration  HOME EXERCISE PROGRAM: Access Code: [email or web address] URL: [email or web address] Date: 04/11/2023 Prepared by: [facility] -  Outpatient  Rehab - [facility]  Exercises - Seated Cervical Retraction  - 1-2 x daily - 7 x weekly - 2 sets - 5-10 reps - 3 sec hold  GOALS: Goals reviewed with patient? Yes  SHORT TERM GOALS: Target date: 04/21/2023  Pt will be independent with HEP for improved pain and dizziness. Baseline: Goal status: IN PROGRESS  LONG TERM GOALS: Target date: 05/19/2023  Pt will be independent with progression of HEP for improved pain and dizziness. Baseline:  Goal status: IN PROGRESS  2.  Pt will report pain decreased by at least 50% for improved participation in daily activities. Baseline: 8-10/10 Goal status: IN PROGRESS  3.  Pt will improve DHI score to less than or equal to 54, to demo decreased limitations by dizziness in daily activities. Baseline:  Goal status: IN PROGRESS  4.  FGA score to be assessed, with score to improve to at least 22/30 for decreased fall risk/improved functional mobility. Baseline: 17 04/13/23 Goal status: IN PROGRESS 04/13/23   ASSESSMENT:  CLINICAL IMPRESSION: Patient arrived to session with report of improved HA's since last session. Continued with MT to address muscle tightness and joint stiffness. Patient tolerated this well. Proceeded with progression of gentle cervical stretching. Patient tolerated all of these activities well and responded well to cueing for proper form. HEP was updated with exercises that were well-tolerated today. Patient reported feeling "looser and not quite as stiff" upon leaving.   OBJECTIVE IMPAIRMENTS: Abnormal gait, decreased ROM, dizziness, impaired flexibility, postural dysfunction, and pain.   ACTIVITY LIMITATIONS: bending, standing, squatting, reach over head, locomotion level, and caring for others  PARTICIPATION LIMITATIONS: meal prep, cleaning, laundry, driving, shopping, and community activity  PERSONAL FACTORS: 3+ comorbidities: see PMH  are also affecting patient's functional outcome.   REHAB  POTENTIAL: Good  CLINICAL DECISION MAKING: Stable/uncomplicated  EVALUATION COMPLEXITY: Low   PLAN:  PT FREQUENCY: 2x/week  PT DURATION: 6 weeks  PLANNED INTERVENTIONS: 97110-Therapeutic exercises, 97530- Therapeutic activity, 97112- Neuromuscular re-education, 97535- Self Care, 16109- Manual therapy, Patient/Family education, Balance training, Dry Needling, Vestibular training, and Moist heat  PLAN FOR NEXT SESSION:  progress gentle neck stretches, STM to L shoulder/neck muscles; possibility of DN for pain in conjunction with stretching?   Baldemar Friday, PT, DPT 04/25/23 12:29 PM  [facility] [address], [address] [place], [place] [address] Phone # [phone] Fax # [phone]

## 2023-04-25 ENCOUNTER — Ambulatory Visit: Payer: Medicare Other | Admitting: Physical Therapy

## 2023-04-25 ENCOUNTER — Encounter: Payer: Self-pay | Admitting: Physical Therapy

## 2023-04-25 DIAGNOSIS — M542 Cervicalgia: Secondary | ICD-10-CM

## 2023-04-25 DIAGNOSIS — R42 Dizziness and giddiness: Secondary | ICD-10-CM

## 2023-04-27 ENCOUNTER — Ambulatory Visit: Payer: Medicare Other | Admitting: Physical Therapy

## 2023-04-28 NOTE — Therapy (Signed)
 OUTPATIENT PHYSICAL THERAPY VESTIBULAR TREATMENT     Patient Name: Lindaann Gradilla MRN: [id number] DOB:[birth year], 78 y.o., female Today's Date: 05/02/2023  END OF SESSION:  PT End of Session - 05/02/23 1227     Visit Number 5    Number of Visits 12    Date for PT Re-Evaluation 05/19/23    Authorization Type Medicare/[employer]na    Progress Note Due on Visit 10    PT Start Time 1149    PT Stop Time 1230    PT Time Calculation (min) 41 min    Activity Tolerance Patient tolerated treatment well    Behavior During Therapy WFL for tasks assessed/performed                 Past Medical History:  Diagnosis Date   IBS (irritable bowel syndrome)    Past Surgical History:  Procedure Laterality Date   ABDOMINAL HYSTERECTOMY     CESAREAN SECTION     x2   Patient Active Problem List   Diagnosis Date Noted   Osteoarthritis 02/09/2023   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   Neck pain 01/17/2023   Diverticulitis of intestine, part unspecified, without perforation or abscess without bleeding 04/28/2020   Unspecified menopausal and perimenopausal disorder 12/14/2016   Pain in right shoulder 12/14/2016   Other specified disorders of bone density and structure, unspecified site 12/14/2016   Insomnia, unspecified 12/14/2016   Hyperlipidemia, unspecified 12/14/2016   History of colonic polyps 12/14/2016   Gastro-esophageal reflux disease without esophagitis 12/14/2016   Essential (primary) hypertension 12/14/2016   Vitamin D deficiency, unspecified 12/14/2016   Chronic migraine without aura without status migrainosus, not intractable 12/16/2014   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 But my neck feels like it has broken class in it. Reports that "I have not been a good patient this week." Reports good benefit from [facility] last week- did not have any soreness afterwards.   Pt accompanied by: self  PERTINENT HISTORY: hx cervical dystonia, torticollis: pt stated that she has occipita neuralgia, previous botox for cervical dystonia that helped ; HLD, GERD, chronic migraines  PAIN:  Are you having pain? Yes: NPRS scale: 5-6/10 Pain location: L neck and into head Pain description: head rush; sore to touch Aggravating factors: bending head and bending over, turning quickly Relieving factors: butalbital medication helps  PRECAUTIONS: Fall and Other: dizziness and quick motions produce dizziness and pain  RED FLAGS: None   WEIGHT BEARING RESTRICTIONS: No  FALLS: Has patient fallen in last 6 months? No  LIVING ENVIRONMENT: Lives with: lives with their family Lives in: House/apartment Has following equipment at home:  uses cane at times  PLOF: Independent  PATIENT GOALS: To get back to normal-get rid of dizziness and pain  OBJECTIVE:    TODAY'S TREATMENT: 05/02/23 Activity Comments  STM and manual TPR, IASTM Soft tissue restriction and tender trigger pts in L UT, LS, scalenes, paraspinals, suboccipitals. Very tight today but pt reported reduced pain after MT  Diaphragmatic breathing  With hands on belly for feedback  L [place] stretch 2x30"  manual assist to encourage proper positioning   Review of HEP update: Cervical extension SNAG 10x Cervical rotation SNAG 5x each side Cueing for proper positioning.- pt reports "I was doing it wrong" at home. Pt reports discomfort with extension- discontinued     Cervical retraction with self-OF 10x Limited ROM; good  tolerance   Sitting B shoulder ER red TB 2x10 Cues for scap depression and retraction            HOME EXERCISE PROGRAM Access Code: [email or web address] URL: [email or web address] Date: 05/02/2023 Prepared by: [facility] - Outpatient  Rehab - [facility]  Program Notes perform standing exercises at counter for safety. perform neck stretches to tolerance.  Exercises - Seated Cervical Retraction  - 1-2 x daily - 7 x weekly - 2 sets - 5-10 reps - 3 sec hold - Standing Toe Taps  - 1 x daily - 5 x weekly - 2 sets - 10 reps - Romberg Stance with Eyes Closed  - 1 x daily - 5 x weekly - 2 sets - 30 sec hold - Walking with Eyes Closed and Counter Support  - 1 x daily - 5 x weekly - 2 sets - 5 reps - Seated Cervical Sidebending AROM  - 1 x daily - 5 x weekly - 2 sets - 10 reps - Seated Cervical Flexion AROM  - 1 x daily - 5 x weekly - 2 sets - 10 reps - Seated Assisted Cervical Rotation with Towel  - 1 x daily - 5 x weekly - 2 sets - 10 reps    PATIENT EDUCATION:  Education details: advised to discontinue cervical extension SNAGs , POC Person educated: Patient Education method: Explanation, Demonstration, Tactile cues, and Verbal cues Education comprehension: verbalized understanding      Note: Objective measures were completed at Evaluation unless otherwise noted.  DIAGNOSTIC FINDINGS: This MRI of cervical spine with and without contrast shows the following: The spinal cord appears normal.   Multilevel degenerative changes from C2-T1 as detailed above.  Although the central canal is mildly narrowed at several levels, there is no spinal stenosis.  Multilevel foraminal narrowing but no nerve root compression. At C3-C4, there is severe loss of disc height and ankylosis of the left facet joint associated with mild facet hypertrophy.  There is foraminal narrowing but no spinal stenosis or nerve root compression.  Unremarkable MRI brain . No acute findings   COGNITION: Overall  cognitive status: Within functional limits for tasks assessed   SENSATION: Does report hx of carpal tunnel; so some tingling; occasional numbness into L arm and hand  POSTURE:  rounded shoulders, forward head, and   slight tilt towards R side  Cervical ROM:    Active A/PROM (deg) eval  Flexion 18 degrees, tightness  Extension 8 degrees pain  Right lateral flexion   Left lateral flexion   Right rotation 49  Left rotation 35 pain  (Blank rows = not tested)  TRANSFERS: Assistive device utilized: None  Sit to stand: Complete Independence Stand to sit: Complete Independence  GAIT: Gait pattern: WFL  PATIENT SURVEYS:  DHI Score 72 (>54 points indicates severe handicap)  PALPATION: Tenderness to palpation L UT and levator  VESTIBULAR ASSESSMENT:  GENERAL OBSERVATION: Pt in no acute distress, but posture is somewhat guarded due to reports of dizziness   SYMPTOM BEHAVIOR: Subjective history: Have occipital neuralgia, for which I got periodic injections (Botox every 4-5 months) when I lived in [place].  When I  had the Botox injection with Dr. Terrace Arabia on December 4th, I had never had pain that way and pain shot into my head and scalp.  The pain has persisted and I can't bend over because I get dizzy and get a rush of pain into my head.  Almost makes me sick.  Turning over either way causes a "rush" into my head. Reports sensation a shooting pain>dizziness  Non-Vestibular symptoms: diplopia, neck pain, headaches, and migraine symptoms  Pt reports diplopia with L head turn to end range  Type of dizziness: Imbalance (Disequilibrium), Unsteady with head/body turns, and fogginess  Frequency: multiple times/day  Duration: few minutes  Aggravating factors: Induced by motion: bending down to the ground, turning body quickly, and turning head quickly  Relieving factors: closing eyes and slow movements  Progression of symptoms: unchanged  OCULOMOTOR EXAM: wears bifocals/progressives  Ocular  Alignment: normal  Ocular ROM: No Limitations  Spontaneous Nystagmus: absent  Gaze-Induced Nystagmus: absent  Smooth Pursuits: intact  Saccades: intact  Convergence/Divergence: NT    VESTIBULAR - OCULAR REFLEX:   Slow VOR: Normal and Comment: no dizziness, but pain in L neck  VOR Cancellation: Normal  Head-Impulse Test: HIT Right: negative HIT Left: negative  Dynamic Visual Acuity:  NT  *Pt has history of multiple eye surgeries on L eye (from an injury years ago)- with some motions above, pt's L eye has very slowed abduction movements  POSITIONAL TESTING: Did not test, due to pt not c/o positional vertigo symptoms  MOTION SENSITIVITY:  Motion Sensitivity Quotient Intensity: 0 = none, 1 = Lightheaded, 2 = Mild, 3 = Moderate, 4 = Severe, 5 = Vomiting  Intensity  1. Sitting to supine   2. Supine to L side   3. Supine to R side   4. Supine to sitting   5. L Hallpike-Dix   6. Up from L    7. R Hallpike-Dix   8. Up from R    9. Sitting, head tipped to L knee   10. Head up from L knee   11. Sitting, head tipped to R knee   12. Head up from R knee   13. Sitting head turns x5   14.Sitting head nods x5   15. In stance, 180 turn to L    16. In stance, 180 turn to R                                                                                                                                  TREATMENT DATE: 04/10/2023   Other: Initiated gentle stretching neck-attempted neck retraction/extension and cervical rotation with towel; pt has discomfort Performed seated chin tuck/neck retraction, A/ROM, with no c/o increased pain (provided as HEP)  PATIENT EDUCATION: Education details: Eval results, POC, HEP initiated Person educated: Patient Education method: Explanation, Demonstration, Verbal cues, and Handouts Education comprehension: verbalized understanding and returned demonstration  HOME EXERCISE PROGRAM: Access Code: [email or web address] URL:  [email or web address]  Date: 04/11/2023 Prepared by: [facility] - Outpatient  Rehab - [facility]  Exercises - Seated Cervical Retraction  - 1-2 x daily - 7 x weekly - 2 sets - 5-10 reps - 3 sec hold  GOALS: Goals reviewed with patient? Yes  SHORT TERM GOALS: Target date: 04/21/2023  Pt will be independent with HEP for improved pain and dizziness. Baseline: Goal status: IN PROGRESS  LONG TERM GOALS: Target date: 05/19/2023  Pt will be independent with progression of HEP for improved pain and dizziness. Baseline:  Goal status: IN PROGRESS  2.  Pt will report pain decreased by at least 50% for improved participation in daily activities. Baseline: 8-10/10 Goal status: IN PROGRESS  3.  Pt will improve DHI score to less than or equal to 54, to demo decreased limitations by dizziness in daily activities. Baseline:  Goal status: IN PROGRESS  4.  FGA score to be assessed, with score to improve to at least 22/30 for decreased fall risk/improved functional mobility. Baseline: 17 04/13/23 Goal status: IN PROGRESS 04/13/23   ASSESSMENT:  CLINICAL IMPRESSION: Patient arrived to session with report of increased pain today; admits to limited HEP compliance recently. MT focused of addressing soft tissue restriction which was more apparent today compared to previous sessions. Proceeded with review of HEP update- patient reported discomfort with cervical extension SNAGs, thus these were removed from HEP. Remainder of exercises performed without complaints; cueing and correction provided PRN. Reported improved L sided neck pain upon leaving.   OBJECTIVE IMPAIRMENTS: Abnormal gait, decreased ROM, dizziness, impaired flexibility, postural dysfunction, and pain.   ACTIVITY LIMITATIONS: bending, standing, squatting, reach over head, locomotion level, and caring for others  PARTICIPATION LIMITATIONS: meal prep, cleaning, laundry, driving, shopping, and community activity  PERSONAL  FACTORS: 3+ comorbidities: see PMH  are also affecting patient's functional outcome.   REHAB POTENTIAL: Good  CLINICAL DECISION MAKING: Stable/uncomplicated  EVALUATION COMPLEXITY: Low   PLAN:  PT FREQUENCY: 2x/week  PT DURATION: 6 weeks  PLANNED INTERVENTIONS: 97110-Therapeutic exercises, 97530- Therapeutic activity, 97112- Neuromuscular re-education, 97535- Self Care, 40981- Manual therapy, Patient/Family education, Balance training, Dry Needling, Vestibular training, and Moist heat  PLAN FOR NEXT SESSION:  progress gentle neck stretches, STM to L shoulder/neck muscles; possibility of DN for pain in conjunction with stretching?   Baldemar Friday, PT, DPT 05/02/23 12:34 PM  [facility] [address] [place], [address] [place], [place] [address] Phone # [phone] Fax # [phone]

## 2023-05-02 ENCOUNTER — Encounter: Payer: Self-pay | Admitting: Physical Therapy

## 2023-05-02 ENCOUNTER — Ambulatory Visit: Payer: Medicare Other | Admitting: Physical Therapy

## 2023-05-02 DIAGNOSIS — M542 Cervicalgia: Secondary | ICD-10-CM

## 2023-05-02 DIAGNOSIS — R42 Dizziness and giddiness: Secondary | ICD-10-CM

## 2023-05-02 NOTE — Therapy (Signed)
 OUTPATIENT PHYSICAL THERAPY VESTIBULAR TREATMENT     Patient Name: Adrienne Luna MRN: [id number] DOB:[birth year], 78 y.o., female Today's Date: 05/04/2023  END OF SESSION:  PT End of Session - 05/04/23 1226     Visit Number 6    Number of Visits 12    Date for PT Re-Evaluation 05/19/23    Authorization Type Medicare/Aetna    Progress Note Due on Visit 10    PT Start Time 1154   pt late   PT Stop Time 1226    PT Time Calculation (min) 32 min    Activity Tolerance Patient tolerated treatment well    Behavior During Therapy WFL for tasks assessed/performed                 Past Medical History:  Diagnosis Date   IBS (irritable bowel syndrome)    Past Surgical History:  Procedure Laterality Date   ABDOMINAL HYSTERECTOMY     CESAREAN SECTION     x2   Patient Active Problem List   Diagnosis Date Noted   Osteoarthritis 02/09/2023   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   Neck pain 01/17/2023   Diverticulitis of intestine, part unspecified, without perforation or abscess without bleeding 04/28/2020   Unspecified menopausal and perimenopausal disorder 12/14/2016   Pain in right shoulder 12/14/2016   Other specified disorders of bone density and structure, unspecified site 12/14/2016   Insomnia, unspecified 12/14/2016   Hyperlipidemia, unspecified 12/14/2016   History of colonic polyps 12/14/2016   Gastro-esophageal reflux disease without esophagitis 12/14/2016   Essential (primary) hypertension 12/14/2016   Vitamin D deficiency, unspecified 12/14/2016   Chronic migraine without aura without status migrainosus, not intractable 12/16/2014   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 "Since I have done all my exercises can I be out of here by 12:30?" Reports some soreness since doing her exercises.   Pt accompanied by: self  PERTINENT HISTORY: hx cervical dystonia, torticollis: pt stated that she has occipita neuralgia, previous botox for cervical dystonia that helped ; HLD, GERD, chronic migraines  PAIN:  Are you having pain? Yes: NPRS scale: mild Pain location: B UT Pain description: sore Aggravating factors: bending head and bending over, turning quickly Relieving factors: butalbital medication helps  PRECAUTIONS: Fall and Other: dizziness and quick motions produce dizziness and pain  RED FLAGS: None   WEIGHT BEARING RESTRICTIONS: No  FALLS: Has patient fallen in last 6 months? No  LIVING ENVIRONMENT: Lives with: lives with their family Lives in: House/apartment Has following equipment at home:  uses cane at times  PLOF: Independent  PATIENT GOALS: To get back to normal-get rid of dizziness and pain  OBJECTIVE:    TODAY'S TREATMENT: 05/04/23 Activity Comments  R/L LS stretch 30" More difficulty towards L  Shoulder shrugs 10x Focus on scap depression   supine cervical retraction 10x3" Good form   supine DNF lift offs   10x5" Report of some difficulty   supine STM and manual TPR To B cervical paraspinals, suboccipitals, UT  HOME EXERCISE PROGRAM Last updated: 04/25/23 Access Code: [email or web address] URL: [email or web address] Date: 04/25/2023 Prepared by: [facility] - Outpatient  Rehab - [facility]  Program Notes perform standing exercises at counter for safety. perform neck stretches to tolerance.  Exercises - Seated Cervical Retraction  - 1-2 x daily - 7 x weekly - 2 sets - 5-10 reps - 3 sec  hold - Standing Toe Taps  - 1 x daily - 5 x weekly - 2 sets - 10 reps - Romberg Stance with Eyes Closed  - 1 x daily - 5 x weekly - 2 sets - 30 sec hold - Walking with Eyes Closed and Counter Support  - 1 x daily - 5 x weekly - 2 sets - 5 reps - Seated Cervical Sidebending AROM  - 1 x daily - 5 x weekly - 2 sets - 10 reps - Seated Cervical Flexion AROM  - 1 x daily - 5 x weekly - 2 sets - 10 reps - Seated Assisted Cervical Rotation with Towel  - 1 x daily - 5 x weekly - 2 sets - 10 reps - Mid-Lower Cervical Extension SNAG with Strap  - 1 x daily - 5 x weekly - 2 sets - 10 reps     Note: Objective measures were completed at Evaluation unless otherwise noted.  DIAGNOSTIC FINDINGS: This MRI of cervical spine with and without contrast shows the following: The spinal cord appears normal.   Multilevel degenerative changes from C2-T1 as detailed above.  Although the central canal is mildly narrowed at several levels, there is no spinal stenosis.  Multilevel foraminal narrowing but no nerve root compression. At C3-C4, there is severe loss of disc height and ankylosis of the left facet joint associated with mild facet hypertrophy.  There is foraminal narrowing but no spinal stenosis or nerve root compression.  Unremarkable MRI brain . No acute findings   COGNITION: Overall cognitive status: Within functional limits for tasks assessed   SENSATION: Does report hx of carpal tunnel; so some tingling; occasional numbness into L arm and hand  POSTURE:  rounded shoulders, forward head, and   slight tilt towards R side  Cervical ROM:    Active A/PROM (deg) eval  Flexion 18 degrees, tightness  Extension 8 degrees pain  Right lateral flexion   Left lateral flexion   Right rotation 49  Left rotation 35 pain  (Blank rows = not tested)  TRANSFERS: Assistive device utilized: None  Sit to stand: Complete Independence Stand to sit: Complete Independence  GAIT: Gait pattern: WFL  PATIENT  SURVEYS:  DHI Score 72 (>54 points indicates severe handicap)  PALPATION: Tenderness to palpation L UT and levator  VESTIBULAR ASSESSMENT:  GENERAL OBSERVATION: Pt in no acute distress, but posture is somewhat guarded due to reports of dizziness   SYMPTOM BEHAVIOR: Subjective history: Have occipital neuralgia, for which I got periodic injections (Botox every 4-5 months) when I lived in [place].  When I had the Botox injection with Dr. Terrace Arabia on December 4th, I had never had pain that way and pain shot into my head and scalp.  The pain has persisted and I can't bend over because I get dizzy and get a rush of pain into my head.  Almost makes me sick.  Turning over either way causes a "rush" into my head. Reports sensation a shooting pain>dizziness  Non-Vestibular symptoms: diplopia, neck pain, headaches, and migraine symptoms  Pt reports diplopia with L head turn to end range  Type of  dizziness: Imbalance (Disequilibrium), Unsteady with head/body turns, and fogginess  Frequency: multiple times/day  Duration: few minutes  Aggravating factors: Induced by motion: bending down to the ground, turning body quickly, and turning head quickly  Relieving factors: closing eyes and slow movements  Progression of symptoms: unchanged  OCULOMOTOR EXAM: wears bifocals/progressives  Ocular Alignment: normal  Ocular ROM: No Limitations  Spontaneous Nystagmus: absent  Gaze-Induced Nystagmus: absent  Smooth Pursuits: intact  Saccades: intact  Convergence/Divergence: NT    VESTIBULAR - OCULAR REFLEX:   Slow VOR: Normal and Comment: no dizziness, but pain in L neck  VOR Cancellation: Normal  Head-Impulse Test: HIT Right: negative HIT Left: negative  Dynamic Visual Acuity:  NT  *Pt has history of multiple eye surgeries on L eye (from an injury years ago)- with some motions above, pt's L eye has very slowed abduction movements  POSITIONAL TESTING: Did not test, due to pt not c/o positional vertigo  symptoms  MOTION SENSITIVITY:  Motion Sensitivity Quotient Intensity: 0 = none, 1 = Lightheaded, 2 = Mild, 3 = Moderate, 4 = Severe, 5 = Vomiting  Intensity  1. Sitting to supine   2. Supine to L side   3. Supine to R side   4. Supine to sitting   5. L Hallpike-Dix   6. Up from L    7. R Hallpike-Dix   8. Up from R    9. Sitting, head tipped to L knee   10. Head up from L knee   11. Sitting, head tipped to R knee   12. Head up from R knee   13. Sitting head turns x5   14.Sitting head nods x5   15. In stance, 180 turn to L    16. In stance, 180 turn to R                                                                                                                                  TREATMENT DATE: 04/10/2023   Other: Initiated gentle stretching neck-attempted neck retraction/extension and cervical rotation with towel; pt has discomfort Performed seated chin tuck/neck retraction, A/ROM, with no c/o increased pain (provided as HEP)  PATIENT EDUCATION: Education details: Eval results, POC, HEP initiated Person educated: Patient Education method: Explanation, Demonstration, Verbal cues, and Handouts Education comprehension: verbalized understanding and returned demonstration  HOME EXERCISE PROGRAM: Access Code: [email or web address] URL: [email or web address] Date: 04/11/2023 Prepared by: [facility] - Outpatient  Rehab - [facility]  Exercises - Seated Cervical Retraction  - 1-2 x daily - 7 x weekly - 2 sets - 5-10 reps - 3 sec hold  GOALS: Goals reviewed with patient? Yes  SHORT TERM GOALS: Target date: 04/21/2023  Pt will be independent with HEP for improved pain and dizziness. Baseline: Goal status: MET  LONG TERM GOALS: Target date: 05/19/2023  Pt will be independent with progression of HEP for improved pain and dizziness. Baseline:  Goal status: IN  PROGRESS  2.  Pt will report pain decreased by at least 50% for improved participation in daily  activities. Baseline: 8-10/10 Goal status: IN PROGRESS  3.  Pt will improve DHI score to less than or equal to 54, to demo decreased limitations by dizziness in daily activities. Baseline:  Goal status: IN PROGRESS  4.  FGA score to be assessed, with score to improve to at least 22/30 for decreased fall risk/improved functional mobility. Baseline: 17 04/13/23 Goal status: IN PROGRESS 04/13/23   ASSESSMENT:  CLINICAL IMPRESSION: Patient arrived to session with report of some UT soreness. Cervical postural activities were performed without pain. Patient tolerated MT to address soft tissue restriction, with report of good benefit and less soreness upon leaving.   OBJECTIVE IMPAIRMENTS: Abnormal gait, decreased ROM, dizziness, impaired flexibility, postural dysfunction, and pain.   ACTIVITY LIMITATIONS: bending, standing, squatting, reach over head, locomotion level, and caring for others  PARTICIPATION LIMITATIONS: meal prep, cleaning, laundry, driving, shopping, and community activity  PERSONAL FACTORS: 3+ comorbidities: see PMH  are also affecting patient's functional outcome.   REHAB POTENTIAL: Good  CLINICAL DECISION MAKING: Stable/uncomplicated  EVALUATION COMPLEXITY: Low   PLAN:  PT FREQUENCY: 2x/week  PT DURATION: 6 weeks  PLANNED INTERVENTIONS: 97110-Therapeutic exercises, 97530- Therapeutic activity, 97112- Neuromuscular re-education, 97535- Self Care, 16109- Manual therapy, Patient/Family education, Balance training, Dry Needling, Vestibular training, and Moist heat  PLAN FOR NEXT SESSION:  progress gentle neck stretches, STM to L shoulder/neck muscles; possibility of DN for pain in conjunction with stretching?   Baldemar Friday, PT, DPT 05/04/23 12:32 PM  [facility] at [facility] [address] [place], [address] [place], [place] [address] Phone # [phone] Fax # [phone]

## 2023-05-03 ENCOUNTER — Ambulatory Visit: Payer: Medicare Other | Admitting: Neurology

## 2023-05-04 ENCOUNTER — Encounter: Payer: Self-pay | Admitting: Physical Therapy

## 2023-05-04 ENCOUNTER — Ambulatory Visit: Payer: Medicare Other | Admitting: Physical Therapy

## 2023-05-04 DIAGNOSIS — M542 Cervicalgia: Secondary | ICD-10-CM

## 2023-05-04 DIAGNOSIS — R42 Dizziness and giddiness: Secondary | ICD-10-CM

## 2023-05-08 NOTE — Therapy (Signed)
 OUTPATIENT PHYSICAL THERAPY VESTIBULAR TREATMENT     Patient Name: Adrienne Luna MRN: [id number] DOB:May 15, 1945, 78 y.o., female Today's Date: 05/08/2023  END OF SESSION:        Past Medical History:  Diagnosis Date   IBS (irritable bowel syndrome)    Past Surgical History:  Procedure Laterality Date   ABDOMINAL HYSTERECTOMY     CESAREAN SECTION     x2   Patient Active Problem List   Diagnosis Date Noted   Osteoarthritis 02/09/2023   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   Neck pain 01/17/2023   Diverticulitis of intestine, part unspecified, without perforation or abscess without bleeding 04/28/2020   Unspecified menopausal and perimenopausal disorder 12/14/2016   Pain in right shoulder 12/14/2016   Other specified disorders of bone density and structure, unspecified site 12/14/2016   Insomnia, unspecified 12/14/2016   Hyperlipidemia, unspecified 12/14/2016   History of colonic polyps 12/14/2016   Gastro-esophageal reflux disease without esophagitis 12/14/2016   Essential (primary) hypertension 12/14/2016   Vitamin D deficiency, unspecified 12/14/2016   Chronic migraine without aura without status migrainosus, not intractable 12/16/2014   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  ONSET DATE: 03/28/2023 (MD referral)  Rationale for Evaluation and Treatment: Rehabilitation  SUBJECTIVE:   SUBJECTIVE STATEMENT: Apologized for being late- count not find a parking spot. "Since I have done all my exercises can I be out of here by 12:30?" Reports some soreness since doing her exercises.   Pt accompanied by: self  PERTINENT HISTORY: hx cervical dystonia, torticollis:  pt stated that she has occipita neuralgia, previous botox for cervical dystonia that helped ; HLD, GERD, chronic migraines  PAIN:  Are you having pain? Yes: NPRS scale: mild Pain location: B UT Pain description: sore Aggravating factors: bending head and bending over, turning quickly Relieving factors: butalbital medication helps  PRECAUTIONS: Fall and Other: dizziness and quick motions produce dizziness and pain  RED FLAGS: None   WEIGHT BEARING RESTRICTIONS: No  FALLS: Has patient fallen in last 6 months? No  LIVING ENVIRONMENT: Lives with: lives with their family Lives in: House/apartment Has following equipment at home:  uses cane at times  PLOF: Independent  PATIENT GOALS: To get back to normal-get rid of dizziness and pain  OBJECTIVE:      TODAY'S TREATMENT: 05/10/23 Activity Comments                        TODAY'S TREATMENT: 05/04/23 Activity Comments  R/L LS stretch 30" More difficulty towards L  Shoulder shrugs 10x Focus on scap depression   supine cervical retraction 10x3" Good form   supine DNF lift offs   10x5" Report of some difficulty   supine STM and manual TPR To B cervical paraspinals, suboccipitals, UT        HOME EXERCISE PROGRAM Last updated: 04/25/23 Access Code: [email or web address] URL: [email or web address] Date: 04/25/2023 Prepared by: [facility] - Outpatient  Rehab - [facility]  Program Notes perform standing exercises at counter for safety. perform neck stretches to tolerance.  Exercises - Seated Cervical Retraction  - 1-2 x daily - 7 x weekly - 2 sets - 5-10 reps - 3 sec hold - Standing Toe  Taps  - 1 x daily - 5 x weekly - 2 sets - 10 reps - Romberg Stance with Eyes Closed  - 1 x daily - 5 x weekly - 2 sets - 30 sec hold - Walking with Eyes Closed and Counter Support  - 1 x daily - 5 x weekly - 2 sets - 5 reps - Seated Cervical Sidebending AROM  - 1 x daily - 5 x weekly - 2 sets - 10 reps - Seated Cervical Flexion  AROM  - 1 x daily - 5 x weekly - 2 sets - 10 reps - Seated Assisted Cervical Rotation with Towel  - 1 x daily - 5 x weekly - 2 sets - 10 reps - Mid-Lower Cervical Extension SNAG with Strap  - 1 x daily - 5 x weekly - 2 sets - 10 reps     Note: Objective measures were completed at Evaluation unless otherwise noted.  DIAGNOSTIC FINDINGS: This MRI of cervical spine with and without contrast shows the following: The spinal cord appears normal.   Multilevel degenerative changes from C2-T1 as detailed above.  Although the central canal is mildly narrowed at several levels, there is no spinal stenosis.  Multilevel foraminal narrowing but no nerve root compression. At C3-C4, there is severe loss of disc height and ankylosis of the left facet joint associated with mild facet hypertrophy.  There is foraminal narrowing but no spinal stenosis or nerve root compression.  Unremarkable MRI brain . No acute findings   COGNITION: Overall cognitive status: Within functional limits for tasks assessed   SENSATION: Does report hx of carpal tunnel; so some tingling; occasional numbness into L arm and hand  POSTURE:  rounded shoulders, forward head, and   slight tilt towards R side  Cervical ROM:    Active A/PROM (deg) eval  Flexion 18 degrees, tightness  Extension 8 degrees pain  Right lateral flexion   Left lateral flexion   Right rotation 49  Left rotation 35 pain  (Blank rows = not tested)  TRANSFERS: Assistive device utilized: None  Sit to stand: Complete Independence Stand to sit: Complete Independence  GAIT: Gait pattern: WFL  PATIENT SURVEYS:  DHI Score 72 (>54 points indicates severe handicap)  PALPATION: Tenderness to palpation L UT and levator  VESTIBULAR ASSESSMENT:  GENERAL OBSERVATION: Pt in no acute distress, but posture is somewhat guarded due to reports of dizziness   SYMPTOM BEHAVIOR: Subjective history: Have occipital neuralgia, for which I got periodic injections  (Botox every 4-5 months) when I lived in [place].  When I had the Botox injection with Dr. Terrace Arabia on December 4th, I had never had pain that way and pain shot into my head and scalp.  The pain has persisted and I can't bend over because I get dizzy and get a rush of pain into my head.  Almost makes me sick.  Turning over either way causes a "rush" into my head. Reports sensation a shooting pain>dizziness  Non-Vestibular symptoms: diplopia, neck pain, headaches, and migraine symptoms  Pt reports diplopia with L head turn to end range  Type of dizziness: Imbalance (Disequilibrium), Unsteady with head/body turns, and fogginess  Frequency: multiple times/day  Duration: few minutes  Aggravating factors: Induced by motion: bending down to the ground, turning body quickly, and turning head quickly  Relieving factors: closing eyes and slow movements  Progression of symptoms: unchanged  OCULOMOTOR EXAM: wears bifocals/progressives  Ocular Alignment: normal  Ocular ROM: No Limitations  Spontaneous Nystagmus: absent  Gaze-Induced Nystagmus: absent  Smooth Pursuits: intact  Saccades: intact  Convergence/Divergence: NT    VESTIBULAR - OCULAR REFLEX:   Slow VOR: Normal and Comment: no dizziness, but pain in L neck  VOR Cancellation: Normal  Head-Impulse Test: HIT Right: negative HIT Left: negative  Dynamic Visual Acuity:  NT  *Pt has history of multiple eye surgeries on L eye (from an injury years ago)- with some motions above, pt's L eye has very slowed abduction movements  POSITIONAL TESTING: Did not test, due to pt not c/o positional vertigo symptoms  MOTION SENSITIVITY:  Motion Sensitivity Quotient Intensity: 0 = none, 1 = Lightheaded, 2 = Mild, 3 = Moderate, 4 = Severe, 5 = Vomiting  Intensity  1. Sitting to supine   2. Supine to L side   3. Supine to R side   4. Supine to sitting   5. L Hallpike-Dix   6. Up from L    7. R Hallpike-Dix   8. Up from R    9. Sitting, head tipped to L knee    10. Head up from L knee   11. Sitting, head tipped to R knee   12. Head up from R knee   13. Sitting head turns x5   14.Sitting head nods x5   15. In stance, 180 turn to L    16. In stance, 180 turn to R                                                                                                                                  TREATMENT DATE: 04/10/2023   Other: Initiated gentle stretching neck-attempted neck retraction/extension and cervical rotation with towel; pt has discomfort Performed seated chin tuck/neck retraction, A/ROM, with no c/o increased pain (provided as HEP)  PATIENT EDUCATION: Education details: Eval results, POC, HEP initiated Person educated: Patient Education method: Explanation, Demonstration, Verbal cues, and Handouts Education comprehension: verbalized understanding and returned demonstration  HOME EXERCISE PROGRAM: Access Code: [email or web address] URL: [email or web address] Date: 04/11/2023 Prepared by: [facility] - Outpatient  Rehab - [facility]  Exercises - Seated Cervical Retraction  - 1-2 x daily - 7 x weekly - 2 sets - 5-10 reps - 3 sec hold  GOALS: Goals reviewed with patient? Yes  SHORT TERM GOALS: Target date: 04/21/2023  Pt will be independent with HEP for improved pain and dizziness. Baseline: Goal status: MET  LONG TERM GOALS: Target date: 05/19/2023  Pt will be independent with progression of HEP for improved pain and dizziness. Baseline:  Goal status: IN PROGRESS  2.  Pt will report pain decreased by at least 50% for improved participation in daily activities. Baseline: 8-10/10 Goal status: IN PROGRESS  3.  Pt will improve DHI score to less than or equal to 54, to demo decreased limitations by dizziness in daily activities. Baseline:  Goal status: IN PROGRESS  4.  FGA score to be assessed, with score to improve  to at least 22/30 for decreased fall risk/improved functional mobility. Baseline: 17 04/13/23 Goal  status: IN PROGRESS 04/13/23   ASSESSMENT:  CLINICAL IMPRESSION: Patient arrived to session with report of some UT soreness. Cervical postural activities were performed without pain. Patient tolerated MT to address soft tissue restriction, with report of good benefit and less soreness upon leaving.   OBJECTIVE IMPAIRMENTS: Abnormal gait, decreased ROM, dizziness, impaired flexibility, postural dysfunction, and pain.   ACTIVITY LIMITATIONS: bending, standing, squatting, reach over head, locomotion level, and caring for others  PARTICIPATION LIMITATIONS: meal prep, cleaning, laundry, driving, shopping, and community activity  PERSONAL FACTORS: 3+ comorbidities: see PMH  are also affecting patient's functional outcome.   REHAB POTENTIAL: Good  CLINICAL DECISION MAKING: Stable/uncomplicated  EVALUATION COMPLEXITY: Low   PLAN:  PT FREQUENCY: 2x/week  PT DURATION: 6 weeks  PLANNED INTERVENTIONS: 97110-Therapeutic exercises, 97530- Therapeutic activity, 97112- Neuromuscular re-education, 97535- Self Care, 40981- Manual therapy, Patient/Family education, Balance training, Dry Needling, Vestibular training, and Moist heat  PLAN FOR NEXT SESSION:  progress gentle neck stretches, STM to L shoulder/neck muscles; possibility of DN for pain in conjunction with stretching?   Baldemar Friday, PT, DPT 05/08/23 8:32 AM  [facility] [address] [place], [address] [place], [place] [address] Phone # [phone] Fax # [phone]

## 2023-05-09 ENCOUNTER — Ambulatory Visit: Payer: Medicare Other | Attending: Neurology | Admitting: Physical Therapy

## 2023-05-09 ENCOUNTER — Encounter: Payer: Self-pay | Admitting: Physical Therapy

## 2023-05-09 DIAGNOSIS — M542 Cervicalgia: Secondary | ICD-10-CM | POA: Insufficient documentation

## 2023-05-09 DIAGNOSIS — R42 Dizziness and giddiness: Secondary | ICD-10-CM | POA: Diagnosis present

## 2023-05-12 ENCOUNTER — Ambulatory Visit: Payer: Medicare Other | Admitting: Physical Therapy

## 2023-05-16 ENCOUNTER — Encounter: Payer: Medicare Other | Admitting: Physical Therapy

## 2023-05-18 ENCOUNTER — Encounter: Payer: Medicare Other | Admitting: Physical Therapy

## 2023-05-23 ENCOUNTER — Ambulatory Visit: Payer: Medicare Other | Admitting: Primary Care

## 2023-07-06 ENCOUNTER — Ambulatory Visit: Admitting: Primary Care

## 2024-01-04 ENCOUNTER — Other Ambulatory Visit: Payer: Self-pay | Admitting: Medical Genetics

## 2024-01-04 DIAGNOSIS — Z006 Encounter for examination for normal comparison and control in clinical research program: Secondary | ICD-10-CM

## 2024-01-08 ENCOUNTER — Encounter: Payer: Self-pay | Admitting: Radiology

## 2024-02-10 IMAGING — US US EXTREM LOW VENOUS*L*
1 series · 14 of 24 positions shown · non-contrast
Comparison: None.

CLINICAL DATA: Prior DVT

EXAM:
LEFT LOWER EXTREMITY VENOUS DOPPLER ULTRASOUND
TECHNIQUE: Gray-scale sonography with compression, as well as color and duplex
ultrasound, were performed to evaluate the deep venous system(s)
from the level of the common femoral vein through the popliteal and
proximal calf veins.

[Series 1: us extrem low venous*left* · 0.08mm/px · 14 of 33 slices shown]
[im 1/33]
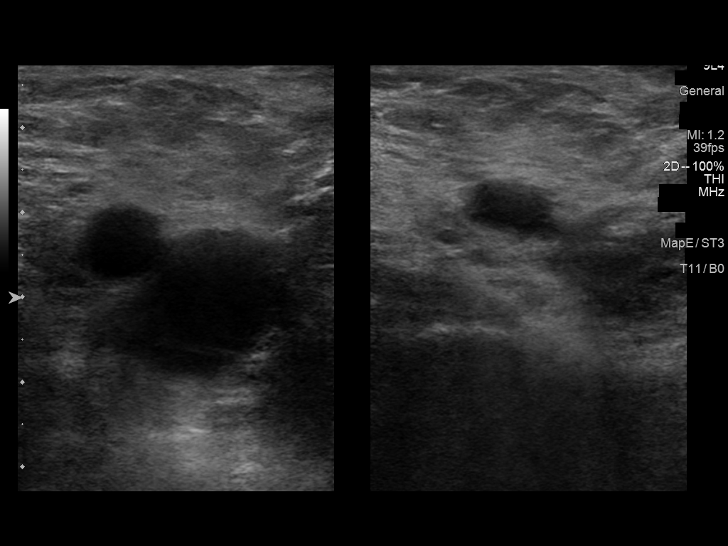
[im 3/33]
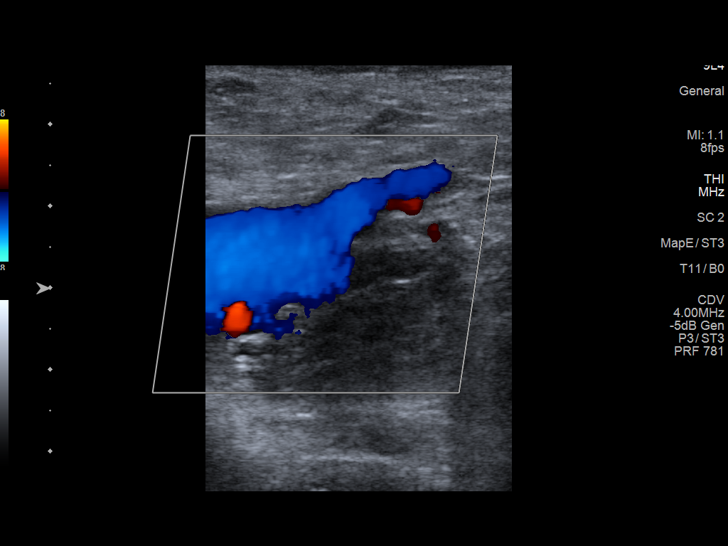
[im 6/33]
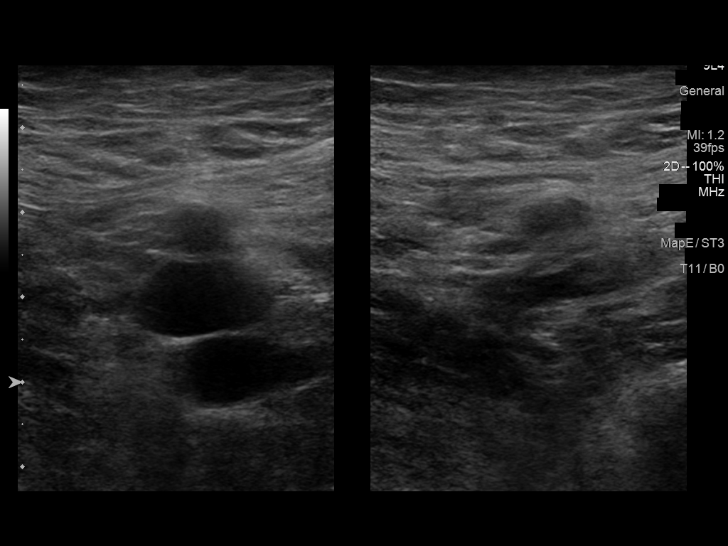
[im 9/33]
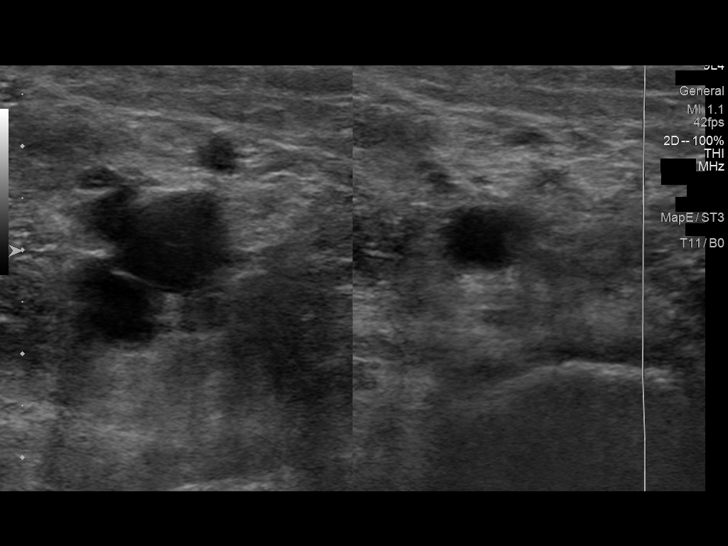
[im 10/33]
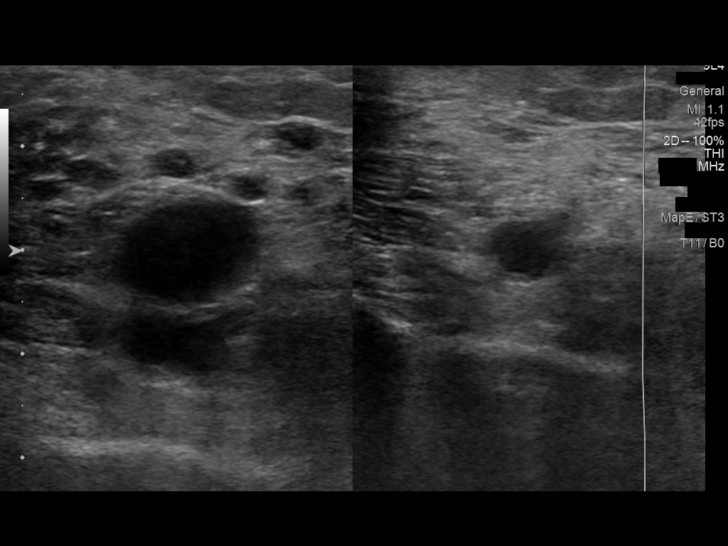
[im 13/33]
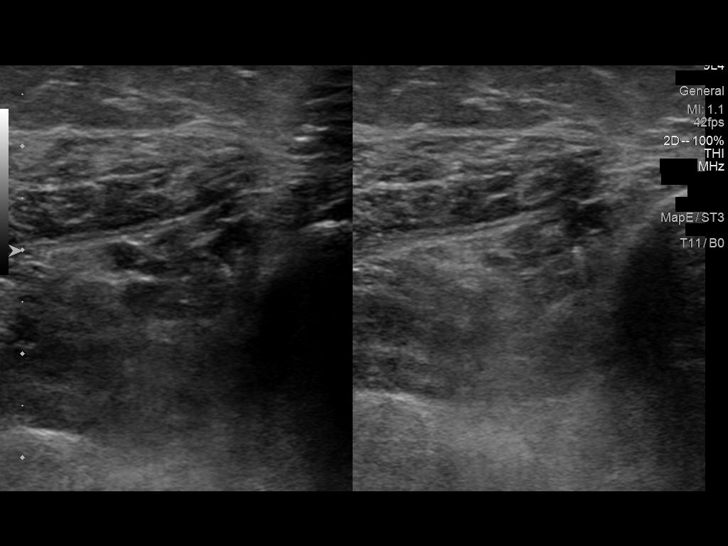
[im 16/33]
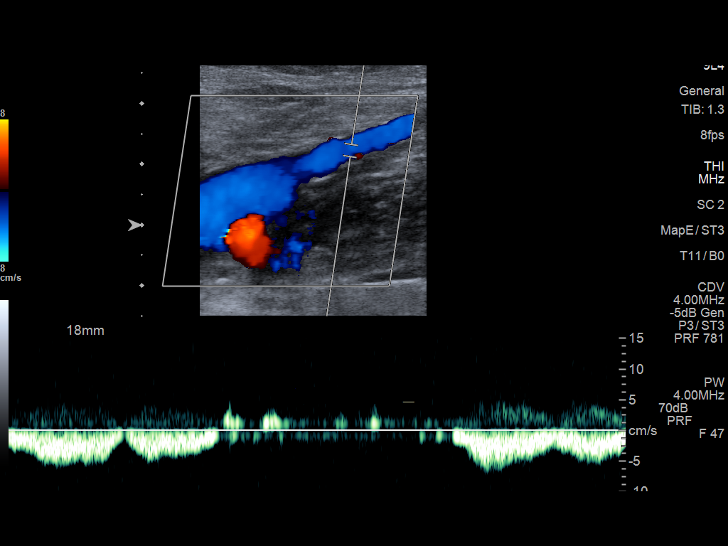
[im 17/33]
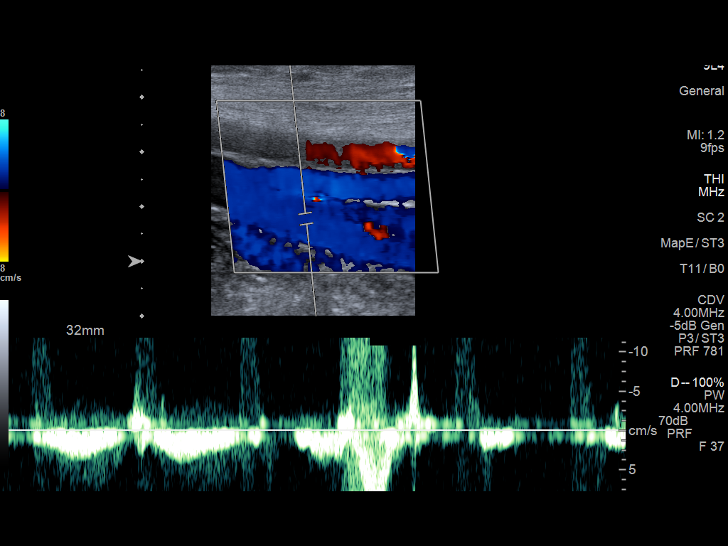
[im 20/33]
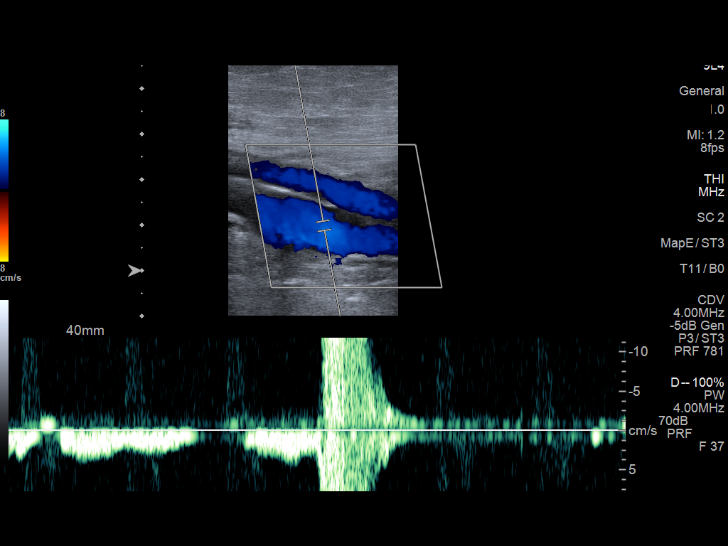
[im 23/33]
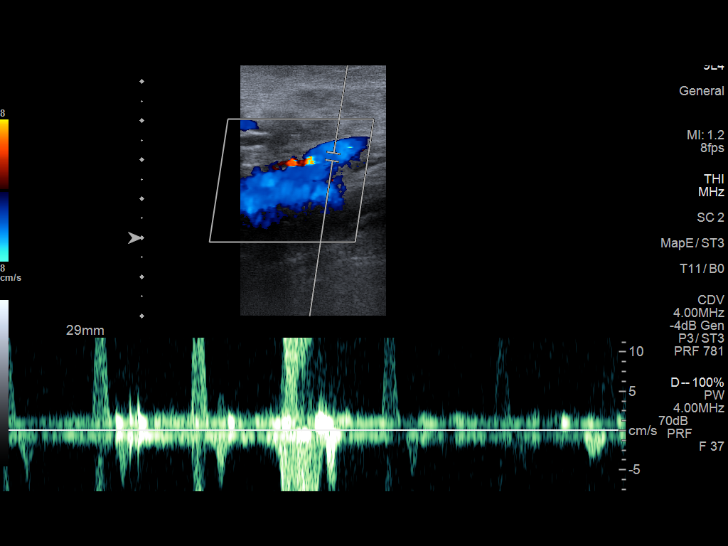
[im 26/33]
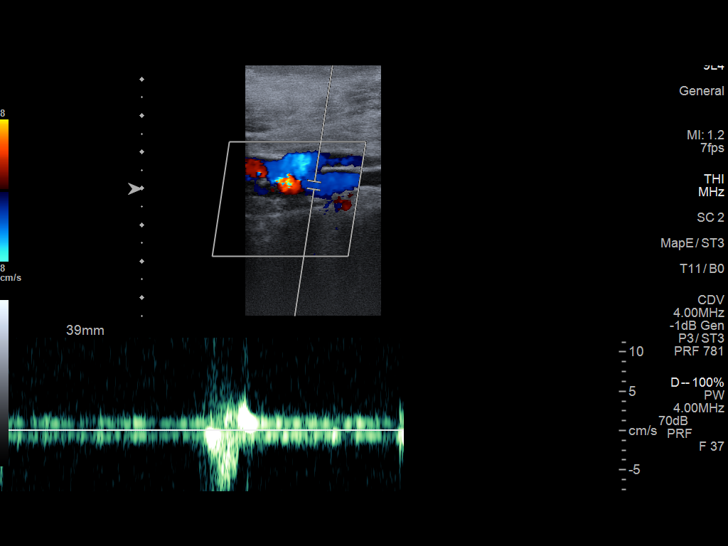
[im 27/33]
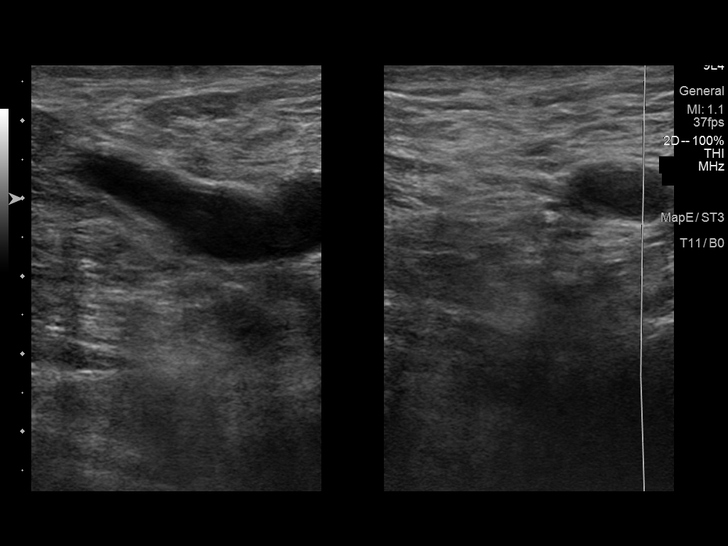
[im 30/33]
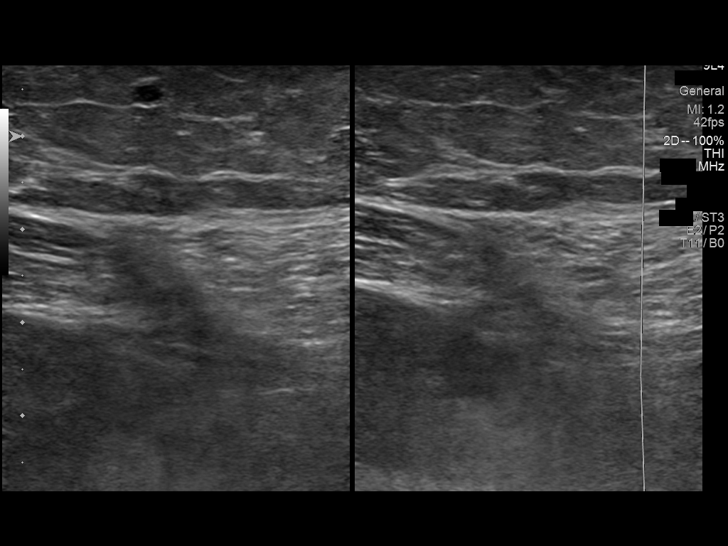
[im 33/33]
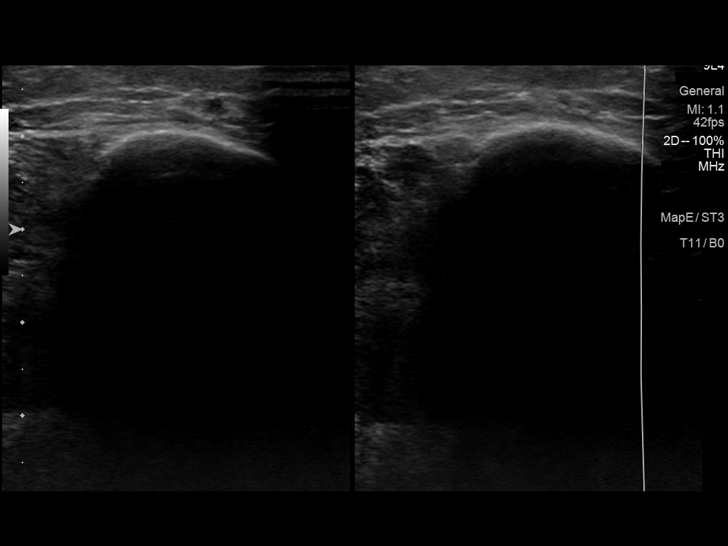

[14 of 24 positions shown; findings below may reference images not displayed]

FINDINGS: VENOUS

Normal compressibility of the common femoral, superficial femoral,
and popliteal veins, as well as the visualized calf veins.
Visualized portions of profunda femoral vein and great saphenous
vein unremarkable. No filling defects to suggest DVT on grayscale or
color Doppler imaging. Doppler waveforms show normal direction of
venous flow, normal respiratory plasticity and response to
augmentation.

Limited views of the contralateral common femoral vein are
unremarkable.

OTHER

None.

Limitations: none
IMPRESSION: No left lower extremity DVT.

## 2024-02-24 IMAGING — US US CAROTID DUPLEX BILAT
1 series · 13 of 24 positions shown · non-contrast
Comparison: None available

CLINICAL DATA: History of carotid stenosis

EXAM:
BILATERAL CAROTID DUPLEX ULTRASOUND
TECHNIQUE: Gray scale imaging, color Doppler and duplex ultrasound were
performed of bilateral carotid and vertebral arteries in the neck.

[Series 1: us carotid duplex bilat · 0.07mm/px · 13 of 68 slices shown]
[im 1/68]
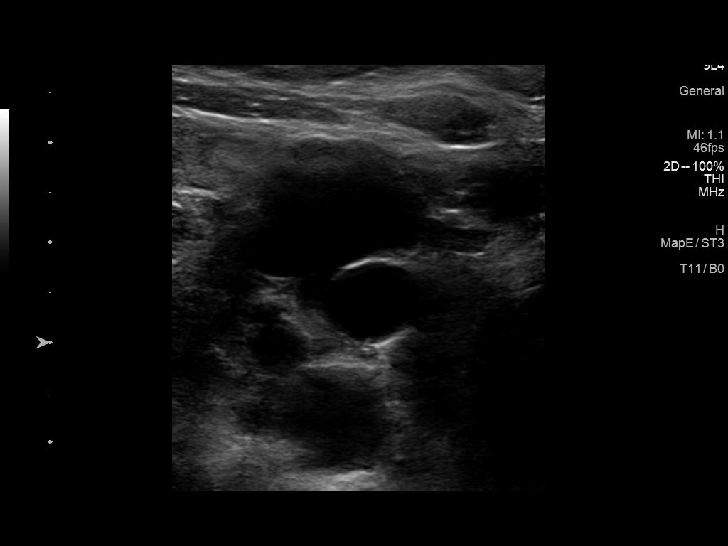
[im 6/68]
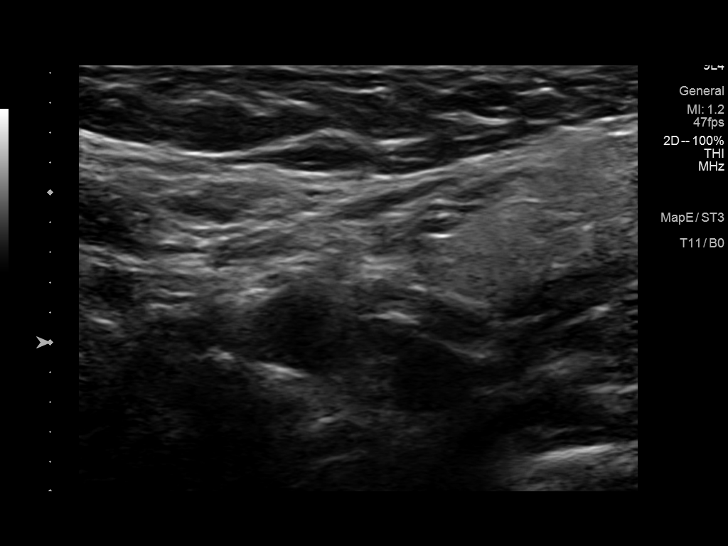
[im 12/68]
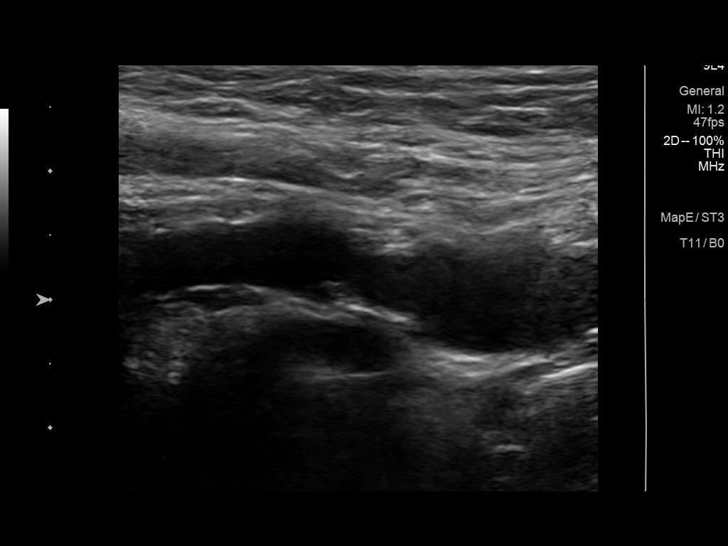
[im 18/68]
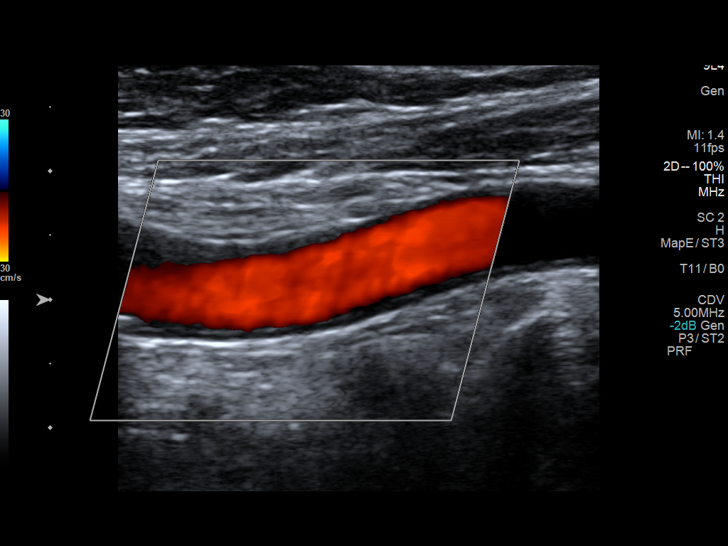
[im 24/68]
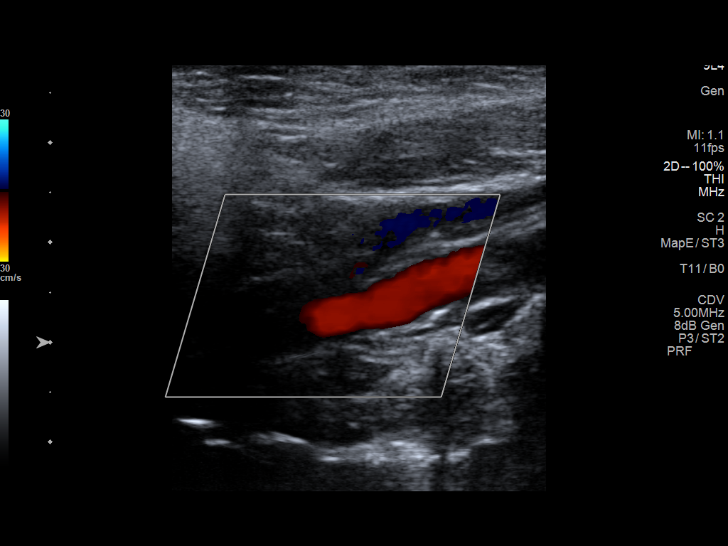
[im 30/68]
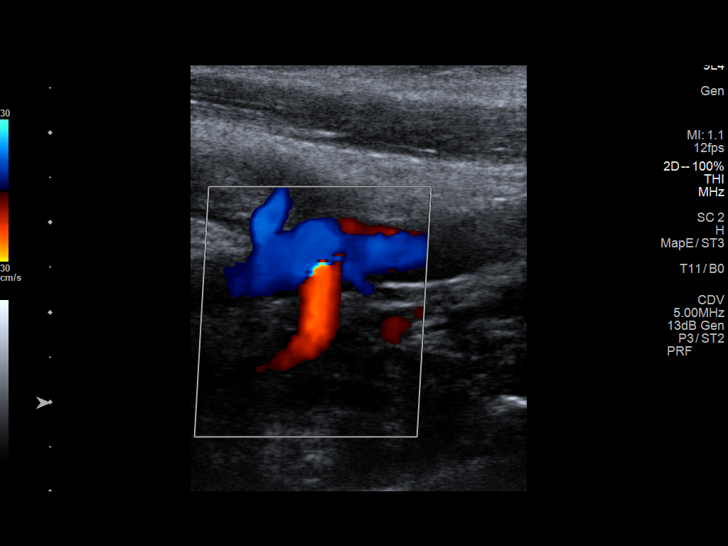
[im 35/68]
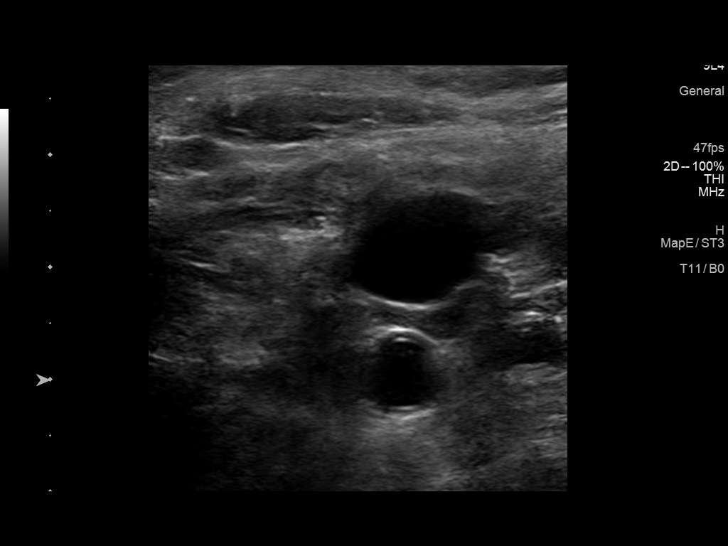
[im 38/68]
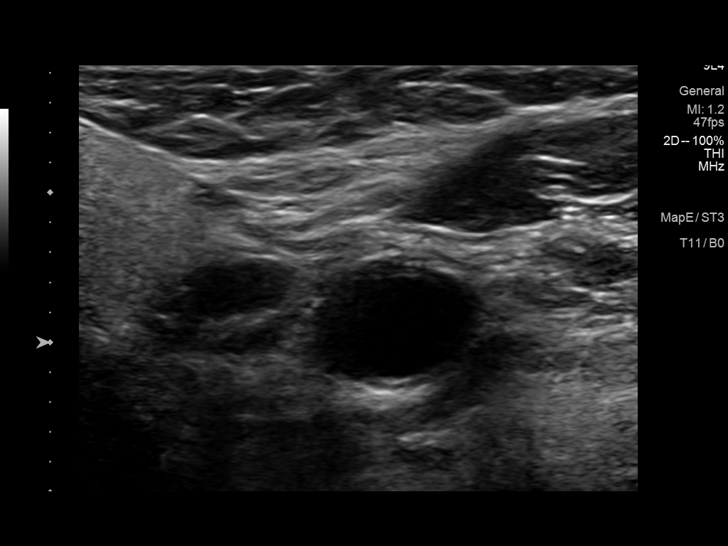
[im 44/68]
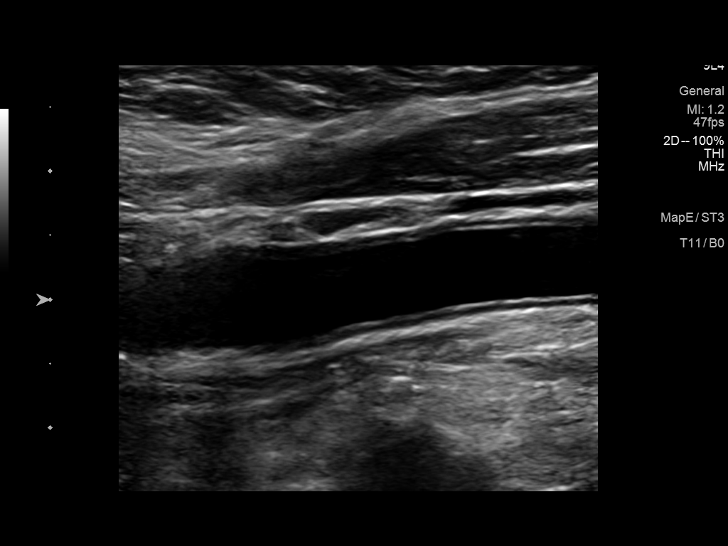
[im 50/68]
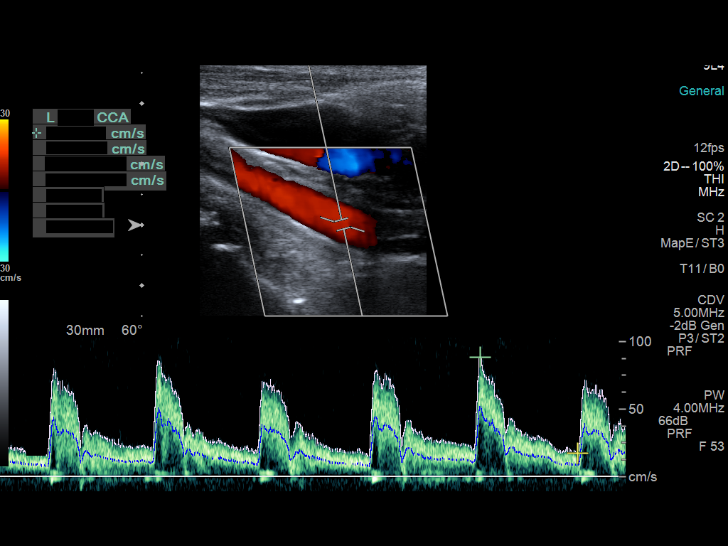
[im 56/68]
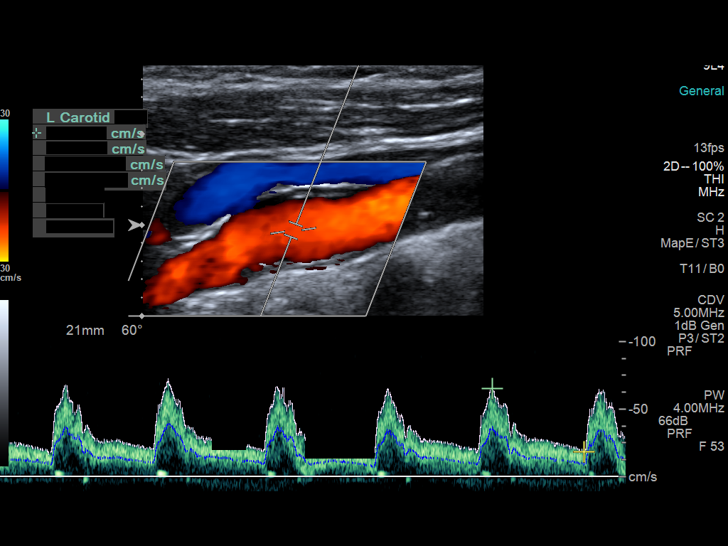
[im 62/68]
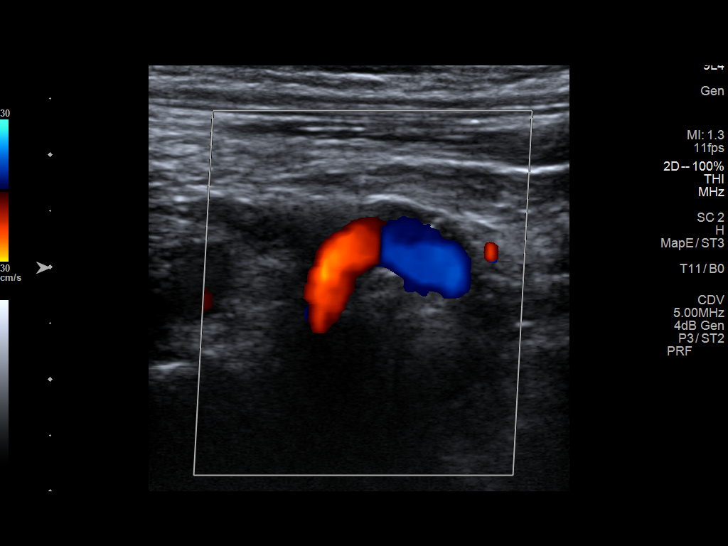
[im 68/68]
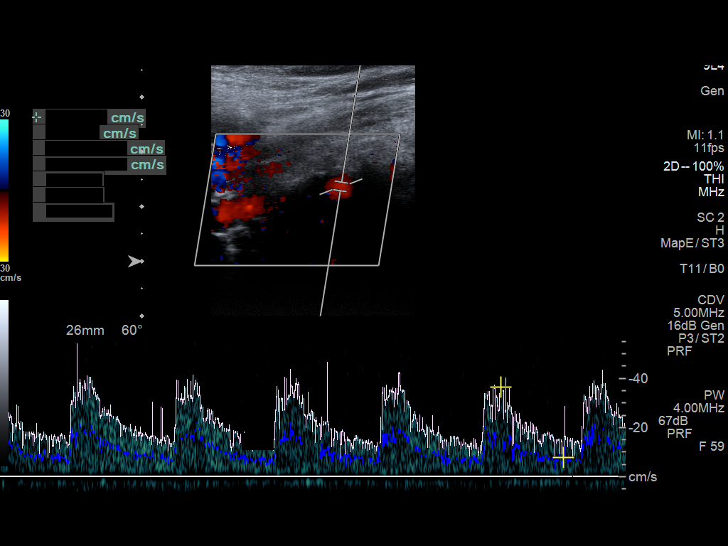

[13 of 24 positions shown; findings below may reference images not displayed]

FINDINGS: Criteria: Quantification of carotid stenosis is based on velocity
parameters that correlate the residual internal carotid diameter
with NASCET-based stenosis levels, using the diameter of the distal
internal carotid lumen as the denominator for stenosis measurement.

The following velocity measurements were obtained:

RIGHT

ICA: 69/20 cm/sec

CCA: 80/21 cm/sec

SYSTOLIC ICA/CCA RATIO:

ECA: 78 cm/sec

LEFT

ICA: 65/24 cm/sec

CCA: 88/17 cm/sec

SYSTOLIC ICA/CCA RATIO:

ECA: 69 cm/sec

RIGHT CAROTID ARTERY: Eccentric partially calcified plaque in the
bulb and proximal ICA resulting in at least mild stenosis. Moderate
tortuosity. Normal waveforms and color Doppler signal throughout
however.

RIGHT VERTEBRAL ARTERY:  Normal flow direction and waveform.

LEFT CAROTID ARTERY: Partially calcified plaque in the bulb and
proximal ICA resulting in only mild stenosis. Moderate ICA
tortuosity. Normal waveforms and color Doppler signal throughout.

LEFT VERTEBRAL ARTERY:  Normal flow direction and waveform.

Upper extremity blood pressures: RIGHT: 132/72 LEFT: 123/77
IMPRESSION: 1. Bilateral carotid bifurcation plaque resulting in less than 50%
diameter ICA stenosis.
2. Antegrade bilateral vertebral arterial flow.

## 2024-03-12 DIAGNOSIS — R109 Unspecified abdominal pain: Secondary | ICD-10-CM

## 2024-03-22 ENCOUNTER — Ambulatory Visit (HOSPITAL_BASED_OUTPATIENT_CLINIC_OR_DEPARTMENT_OTHER)
Admission: RE | Admit: 2024-03-22 | Discharge: 2024-03-22 | Disposition: A | Discharge status: Home / Self Care | Source: Ambulatory Visit | Attending: Internal Medicine | Admitting: Internal Medicine

## 2024-03-22 DIAGNOSIS — R109 Unspecified abdominal pain: Secondary | ICD-10-CM | POA: Diagnosis present

## 2024-03-22 MED ORDER — IOHEXOL 300 MG/ML  SOLN
100.0000 mL | Freq: Once | INTRAMUSCULAR | Status: AC | PRN
  Administered 2024-03-22: 100 mL via INTRAVENOUS
